# Patient Record
Sex: Female | Born: 1970 | Race: Black or African American | Hispanic: No | Marital: Married | Smoking: Never smoker
Health system: Southern US, Community
[De-identification: ages and names within clinical notes are randomized; demographics above are authoritative.]

## PROBLEM LIST (undated history)

## (undated) DIAGNOSIS — I10 Essential (primary) hypertension: Secondary | ICD-10-CM

## (undated) DIAGNOSIS — I499 Cardiac arrhythmia, unspecified: Secondary | ICD-10-CM

## (undated) DIAGNOSIS — F419 Anxiety disorder, unspecified: Secondary | ICD-10-CM

## (undated) DIAGNOSIS — R519 Headache, unspecified: Secondary | ICD-10-CM

## (undated) DIAGNOSIS — D219 Benign neoplasm of connective and other soft tissue, unspecified: Secondary | ICD-10-CM

## (undated) DIAGNOSIS — Z973 Presence of spectacles and contact lenses: Secondary | ICD-10-CM

## (undated) DIAGNOSIS — R51 Headache: Secondary | ICD-10-CM

## (undated) DIAGNOSIS — D649 Anemia, unspecified: Secondary | ICD-10-CM

## (undated) HISTORY — DX: Benign neoplasm of connective and other soft tissue, unspecified: D21.9

## (undated) HISTORY — PX: MYOMECTOMY: SHX85

## (undated) HISTORY — DX: Essential (primary) hypertension: I10

## (undated) HISTORY — PX: TONSILECTOMY/ADENOIDECTOMY WITH MYRINGOTOMY: SHX6125

---
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
## 2000-03-27 ENCOUNTER — Other Ambulatory Visit: Admission: RE | Admit: 2000-03-27 | Discharge: 2000-03-27 | Payer: Self-pay | Admitting: *Deleted

## 2000-03-27 ENCOUNTER — Encounter (INDEPENDENT_AMBULATORY_CARE_PROVIDER_SITE_OTHER): Payer: Self-pay | Admitting: Specialist

## 2000-12-20 ENCOUNTER — Other Ambulatory Visit: Admission: RE | Admit: 2000-12-20 | Discharge: 2000-12-20 | Payer: Self-pay | Admitting: Obstetrics and Gynecology

## 2001-10-02 ENCOUNTER — Other Ambulatory Visit: Admission: RE | Admit: 2001-10-02 | Discharge: 2001-10-02 | Payer: Self-pay | Admitting: Obstetrics and Gynecology

## 2002-03-25 ENCOUNTER — Emergency Department (HOSPITAL_COMMUNITY): Admission: EM | Admit: 2002-03-25 | Discharge: 2002-03-25 | Payer: Self-pay | Admitting: Emergency Medicine

## 2003-02-27 ENCOUNTER — Other Ambulatory Visit: Admission: RE | Admit: 2003-02-27 | Discharge: 2003-02-27 | Payer: Self-pay | Admitting: Obstetrics and Gynecology

## 2003-10-03 ENCOUNTER — Ambulatory Visit (HOSPITAL_COMMUNITY): Admission: RE | Admit: 2003-10-03 | Discharge: 2003-10-03 | Payer: Self-pay | Admitting: Obstetrics

## 2005-07-28 ENCOUNTER — Emergency Department (HOSPITAL_COMMUNITY): Admission: EM | Admit: 2005-07-28 | Discharge: 2005-07-28 | Payer: Self-pay | Admitting: Family Medicine

## 2006-04-26 ENCOUNTER — Ambulatory Visit (HOSPITAL_COMMUNITY): Admission: RE | Admit: 2006-04-26 | Discharge: 2006-04-26 | Payer: Self-pay | Admitting: Family Medicine

## 2007-05-15 ENCOUNTER — Emergency Department (HOSPITAL_COMMUNITY): Admission: EM | Admit: 2007-05-15 | Discharge: 2007-05-15 | Payer: Self-pay | Admitting: Emergency Medicine

## 2007-10-22 ENCOUNTER — Encounter (INDEPENDENT_AMBULATORY_CARE_PROVIDER_SITE_OTHER): Payer: Self-pay | Admitting: Obstetrics and Gynecology

## 2007-10-22 ENCOUNTER — Inpatient Hospital Stay (HOSPITAL_COMMUNITY): Admission: RE | Admit: 2007-10-22 | Discharge: 2007-10-24 | Payer: Self-pay | Admitting: Obstetrics and Gynecology

## 2008-05-09 DIAGNOSIS — D219 Benign neoplasm of connective and other soft tissue, unspecified: Secondary | ICD-10-CM

## 2008-05-09 HISTORY — DX: Benign neoplasm of connective and other soft tissue, unspecified: D21.9

## 2010-09-21 NOTE — Op Note (Signed)
Cassie Ward, SIPE                 ACCOUNT NO.:  [id number]   MEDICAL RECORD NO.:  [id number]          PATIENT TYPE:  INP   LOCATION:  9312                          FACILITY:  [facility]   PHYSICIAN:  Lenoard Aden, M.D.DATE OF BIRTH:  [birth year]   DATE OF PROCEDURE:  10/22/2007  DATE OF DISCHARGE:                               OPERATIVE REPORT   PREOPERATIVE DIAGNOSIS:  Symptomatic uterine fibroids.   POSTOPERATIVE DIAGNOSIS:  Symptomatic uterine fibroids.   PROCEDURE:  1. Exploratory laparotomy.  2. Multiple abdominal myomectomy.   SURGEON:  Lenoard Aden, MD   ASSISTANT:  Leilani Able, PA-C   ANESTHESIA:  General.   BLOOD LOSS:  100 mL.   COMPLICATIONS:  None.   DRAINS:  Foley.   COUNTS:  Correct.   The patient was taken to recovery in good condition.   SPECIMEN:  Multiple total 8 uterine fibroids to pathology.   BRIEF OPERATIVE NOTE:  After we appraised the risks of anesthesia,  infection, bleeding, injury to abdominal organs, need for repair,  delayed versus immediate complications to include bowel, bladder injury,  possible need for repair, inability to prevent recurrent fibroids in  addition to possible need for hysterectomy, the patient was brought to  the operating room where she was administered general anesthetic without  complications, prepped and draped in usual sterile fashion.  Foley  catheter placed after achieving anesthesia with dilute Marcaine solution  placed.  Pfannenstiel skin incision made with a scalpel.  The fascia was  nicked in midline opened transversely using Mayo scissors.  Rectus  muscles dissected sharply in midline, peritoneum entered sharply with  bladder blade.  Uterus is elevated and exteriorized noting multiple  uterine fibroids, one is posterior above the cervix and two in the lower  mid body and upper left cornual area.  Multiple small subserosal  fibroids are noted.  All the fibroid areas are injected using a dilute  Pitressin solution and at this time the 5 small fibroids were excised  using Allis clipped and removed at the base, one of the base is as per  hemostasis, is closed using 0 Vicryl interrupted mattress suture.  At  this time, posterior fibroid is addressed whereby dilute Pitressin  solution is placed and a vertical incision is made along the lower  posterior portion of the uterus and the fibroid is excised using sharp  and blunt dissection.  Fibroid bed is closed using multiple interrupted  0 Vicryl, 2-0 Vicryl sutures, and baseball sutures closed using a 4-0  Monocryl to close the incision in a continuous running fashion.  At this  time, an anterior incision is made in between the 2 large anterior wall  fibroids.  Fibroid caps were identified and both fibroids were excised  using blunt and sharp dissection.  The defect was closed using multiple  interrupted 0 Vicryl sutures and 2-0 Vicryl sutures.  The incision was  closed with a running baseball stitch of 4-0 Monocryl suture in a  continuous running fashion.  Good hemostasis achieved.  The Interceed is  placed in the anterior and posterior wall without  difficulty.  Good  hemostasis is noted.  Fascia then closed using 0 Monocryl in a  continuous running fashion.  Subcutaneous tissue reapproximated using a  2-0 plain in continuous running fashion.  Skin closed using staples.  The patient tolerated the procedure well and is transferred to recovery  in good condition.      Lenoard Aden, M.D.  Electronically Signed     RJT/MEDQ  D:  10/22/2007  T:  10/23/2007  Job:  [id number]

## 2010-09-21 NOTE — H&P (Signed)
Cassie Ward, Cassie Ward                 ACCOUNT NO.:  [id number]   MEDICAL RECORD NO.:  [id number]          PATIENT TYPE:  AMB   LOCATION:  SDC                           FACILITY:  [facility]   PHYSICIAN:  Lenoard Aden, M.D.DATE OF BIRTH:  [birth year]   DATE OF ADMISSION:  10/22/2007  DATE OF DISCHARGE:                              HISTORY & PHYSICAL   CHIEF COMPLAINT:  Symptomatic fibroids.   HISTORY OF PRESENT ILLNESS:  This is a 40 year old African-American  female G3, P1, who presents with symptomatic uterine fibroids for  conservative therapy and removal.   PAST MEDICAL HISTORY:  She has a history of hypertension, fibroid cysts  noted, ovarian cysts, history of pregnancy x3 with abortion x2, and  vaginal surgery x1.   SOCIAL HISTORY:  She is a non-smoker, non-drinker. Denies domestic or  physical violence.   MEDICATIONS:  Include Benicar, HCTZ, Darvocet as needed for pain.   PAST SURGICAL HISTORY:  She has a history of tonsillectomy.   FAMILY HISTORY:  Hypertension, breast cancer, diabetes, and liver  cancer.   PHYSICAL EXAMINATION:  GENERAL:  A well developed, well nourished,  African-American female in no acute distress.  VITAL SIGNS:  Weight 171 pounds.  HEENT:  Normal.  LUNGS:  Clear.  HEART:  Regular rhythm.  ABDOMEN:  Soft, nontender.  GENITOURINARY:  Pelvic examination reveals the uterus to be 12 to 15  week size. No adnexal masses.  EXTREMITIES:  No cords.  NEUROLOGIC:  Non-focal.  SKIN:  Intact.   DIAGNOSTIC STUDIES:  Ultrasound as previously noted reveals 3  predominant fibroids. One is right intramural 5.5 cm. One is left  subserosal level of the cervix 6.2 cm. One is posterior subserosal about  5 cm.   IMPRESSION/PLAN:  Symptomatic uterine fibroids, for myomectomy and  conservative treatment. Surgical treatment, risks of anesthesia,  infection, bleeding, and intra-abdominal procedure discussed.  Complications including bowel and bladder injury, possible  recurrence of  uterine fibroids to 25% to 50% noted, possible need for hysterectomy of  less than 10% is discussed. The patient acknowledges and will proceed.      Lenoard Aden, M.D.  Electronically Signed     RJT/MEDQ  D:  10/21/2007  T:  10/21/2007  Job:  [id number]

## 2010-09-24 NOTE — Discharge Summary (Signed)
Cassie Ward, Cassie Ward                 ACCOUNT NO.:  [id number]   MEDICAL RECORD NO.:  [id number]          PATIENT TYPE:  INP   LOCATION:  9399                          FACILITY:  [facility]   PHYSICIAN:  Lenoard Aden, M.D.DATE OF BIRTH:  26-Jun-1970   DATE OF ADMISSION:  10/22/2007  DATE OF DISCHARGE:  10/24/2007                               DISCHARGE SUMMARY   The patient underwent an uncomplicated multiple myomectomy.   Postoperative course uncomplicated.   Discharged to home on day #2.  Discharge teaching done.  Percocet for  pain.  Follow up in the office in 4-6 weeks.      Lenoard Aden, M.D.  Electronically Signed     RJT/MEDQ  D:  11/17/2007  T:  11/18/2007  Job:  [id number]

## 2011-01-27 LAB — URINE CULTURE: Colony Count: 40000

## 2011-01-27 LAB — POCT URINALYSIS DIP (DEVICE)
Glucose, UA: NEGATIVE
Hgb urine dipstick: NEGATIVE
Nitrite: NEGATIVE
Operator id: 235561
Protein, ur: 30 — AB
Specific Gravity, Urine: 1.02
Urobilinogen, UA: 2 — ABNORMAL HIGH
pH: 6

## 2011-02-03 LAB — CBC
HCT: 33.8 — ABNORMAL LOW
HCT: 31.4 — ABNORMAL LOW
Hemoglobin: 11.5 — ABNORMAL LOW
Hemoglobin: 10.6 — ABNORMAL LOW
MCHC: 34
MCHC: 33.8
MCV: 81.4
MCV: 83.4
Platelets: 358
Platelets: 285
RBC: 4.16
RBC: 3.76 — ABNORMAL LOW
RDW: 15.9 — ABNORMAL HIGH
RDW: 15.7 — ABNORMAL HIGH
WBC: 5.4
WBC: 9.5

## 2011-02-03 LAB — BASIC METABOLIC PANEL
BUN: 6
CO2: 29
Calcium: 9.6
Chloride: 100
Creatinine, Ser: 0.69
GFR calc Af Amer: >60
GFR calc non Af Amer: >60
Glucose, Bld: 105 — ABNORMAL HIGH
Potassium: 3.8
Sodium: 135

## 2011-02-03 LAB — HCG, SERUM, QUALITATIVE: Preg, Serum: NEGATIVE

## 2011-07-07 ENCOUNTER — Other Ambulatory Visit: Payer: 59

## 2011-07-07 ENCOUNTER — Encounter (INDEPENDENT_AMBULATORY_CARE_PROVIDER_SITE_OTHER): Payer: 59 | Admitting: Obstetrics and Gynecology

## 2011-07-07 DIAGNOSIS — D259 Leiomyoma of uterus, unspecified: Secondary | ICD-10-CM

## 2011-07-07 DIAGNOSIS — N979 Female infertility, unspecified: Secondary | ICD-10-CM

## 2011-07-12 ENCOUNTER — Other Ambulatory Visit: Payer: Self-pay | Admitting: Obstetrics and Gynecology

## 2011-07-12 DIAGNOSIS — Z1231 Encounter for screening mammogram for malignant neoplasm of breast: Secondary | ICD-10-CM

## 2011-07-13 ENCOUNTER — Other Ambulatory Visit: Payer: Self-pay | Admitting: Obstetrics and Gynecology

## 2011-07-13 DIAGNOSIS — N979 Female infertility, unspecified: Secondary | ICD-10-CM

## 2011-07-18 ENCOUNTER — Ambulatory Visit (HOSPITAL_COMMUNITY): Payer: Self-pay

## 2011-07-20 ENCOUNTER — Ambulatory Visit: Payer: Self-pay

## 2011-08-17 ENCOUNTER — Ambulatory Visit (HOSPITAL_COMMUNITY)
Admission: RE | Admit: 2011-08-17 | Discharge: 2011-08-17 | Disposition: A | Discharge status: Home / Self Care | Payer: 59 | Source: Ambulatory Visit | Attending: Obstetrics and Gynecology | Admitting: Obstetrics and Gynecology

## 2011-08-17 DIAGNOSIS — D259 Leiomyoma of uterus, unspecified: Secondary | ICD-10-CM | POA: Insufficient documentation

## 2011-08-17 DIAGNOSIS — N979 Female infertility, unspecified: Secondary | ICD-10-CM

## 2011-08-17 MED ORDER — IOHEXOL 300 MG/ML  SOLN
15.0000 mL | Freq: Once | INTRAMUSCULAR | Status: AC | PRN
  Administered 2011-08-17: 15 mL

## 2012-03-30 ENCOUNTER — Telehealth: Payer: Self-pay | Admitting: Obstetrics and Gynecology

## 2012-03-30 NOTE — Telephone Encounter (Signed)
Tc to pt, states she had severe pelvic pain that woke her up out of her sleep last night. She has been having dull pelvic pain at times and has previously had fibroids removed. Pt states that she took advil which somewhat alleviated the pain. Appointment desk already scheduled pt for office visit with AVS for 04/16/12 which is first available. Advised pt that she can be seen at an urgent care or her PCP if she would like, states that she will wait to be seen by AVS. Advised pt that she may alternate ibuprofen and tylenol for pain relief. Pt voiced understanding.

## 2012-04-16 ENCOUNTER — Encounter: Payer: 59 | Admitting: Obstetrics and Gynecology

## 2012-04-18 ENCOUNTER — Telehealth: Payer: Self-pay | Admitting: Obstetrics and Gynecology

## 2012-04-19 ENCOUNTER — Ambulatory Visit (INDEPENDENT_AMBULATORY_CARE_PROVIDER_SITE_OTHER): Payer: 59 | Admitting: Obstetrics and Gynecology

## 2012-04-19 ENCOUNTER — Encounter: Payer: Self-pay | Admitting: Obstetrics and Gynecology

## 2012-04-19 VITALS — BP 120/70 | Ht 64.0 in | Wt 178.0 lb

## 2012-04-19 DIAGNOSIS — N946 Dysmenorrhea, unspecified: Secondary | ICD-10-CM

## 2012-04-19 DIAGNOSIS — D219 Benign neoplasm of connective and other soft tissue, unspecified: Secondary | ICD-10-CM

## 2012-04-19 DIAGNOSIS — N898 Other specified noninflammatory disorders of vagina: Secondary | ICD-10-CM

## 2012-04-19 DIAGNOSIS — D259 Leiomyoma of uterus, unspecified: Secondary | ICD-10-CM

## 2012-04-19 NOTE — Progress Notes (Signed)
HISTORY OF PRESENT ILLNESS  [name] is a 41 y.o. year old female,G2P0010, who presents for a problem visit. The patient complains of increased pelvic pain and dysmenorrhea. The patient has a known history of fibroids. In February 2013 and ultrasound showed a 10.9 x 6.8 cm uterus. 3 fibroids were noted. The largest fibroid measured 3.3 cm in size. She had a prior myomectomy in 2009.  Subjective:  At one time the patient had wanted to have another baby. She no longer wants that. She complains of excessive wetness from the vagina. She denies a vaginal odor and she says it is not a discharge.  Objective:  BP 120/70  Ht 5\' 4"  (1.626 m)  Wt 178 lb (80.74 kg)  BMI 30.55 kg/m2  LMP 03/29/2012   General: no distress GI: soft and nontender  External genitalia: normal general appearance Vaginal: normal without tenderness, induration or masses and relaxation noted Cervix: normal appearance Adnexa: normal bimanual exam Uterus: 10 weeks, irregular, firm, tender  Assessment:  Dysmenorrhea  Pelvic pain  Fibroids  Prior myomectomy  Plan:  Management of fibroids was reviewed. Risk and benefits discussed for each option. The patient is interested in a traditional myomectomy (declines robotic myomectomy).  Ultrasound next visit.  Return to office in 2 week(s).   Leonard Schwartz M.D.  04/19/2012 11:27 AM

## 2012-05-16 ENCOUNTER — Ambulatory Visit (INDEPENDENT_AMBULATORY_CARE_PROVIDER_SITE_OTHER): Payer: 59

## 2012-05-16 ENCOUNTER — Encounter: Payer: Self-pay | Admitting: Obstetrics and Gynecology

## 2012-05-16 ENCOUNTER — Ambulatory Visit (INDEPENDENT_AMBULATORY_CARE_PROVIDER_SITE_OTHER): Payer: 59 | Admitting: Obstetrics and Gynecology

## 2012-05-16 VITALS — BP 110/80 | Resp 18 | Wt 179.0 lb

## 2012-05-16 DIAGNOSIS — N949 Unspecified condition associated with female genital organs and menstrual cycle: Secondary | ICD-10-CM

## 2012-05-16 DIAGNOSIS — D259 Leiomyoma of uterus, unspecified: Secondary | ICD-10-CM

## 2012-05-16 DIAGNOSIS — N920 Excessive and frequent menstruation with regular cycle: Secondary | ICD-10-CM

## 2012-05-16 DIAGNOSIS — D219 Benign neoplasm of connective and other soft tissue, unspecified: Secondary | ICD-10-CM | POA: Insufficient documentation

## 2012-05-16 DIAGNOSIS — N946 Dysmenorrhea, unspecified: Secondary | ICD-10-CM

## 2012-05-16 DIAGNOSIS — R102 Pelvic and perineal pain unspecified side: Secondary | ICD-10-CM | POA: Insufficient documentation

## 2012-05-16 NOTE — Progress Notes (Signed)
HISTORY OF PRESENT ILLNESS  [name] is a 42 y.o. year old female,G2P0010, who presents for a problem visit. The patient has a known history of fibroids. She wants to have children. She has had a past myomectomy. She has a history of hypertension.  Subjective:  The patient reports that she has dysmenorrhea as well as menorrhagia. Reports that she takes lots of ibuprofen. Her back also hurts.  Objective:  BP 110/80  Resp 18  Wt 179 lb (81.194 kg)  LMP 04/28/2012   GI: soft and nontender back: No CVA tenderness  Exam deferred.  Ultrasound: Uterus is 8.86 x 7.88 cm. Endometrium measures 4-5 fibroids are seen. The largest fibroid measures 2.9 cm and it seems to be submucosal.  Assessment:  Fibroid uterus  Menorrhagia  Dysmenorrhea  Prior myomectomy  Pelvic pain  Plan:  Management options were reviewed. Risk and benefits were discussed. The patient will consider hysteroscopic resection of the submucosal fibroid. A handout was given to the patient.  Return to office prn if symptoms worsen or fail to improve.   Leonard Schwartz M.D.  05/16/2012 6:22 PM

## 2012-06-11 ENCOUNTER — Encounter: Payer: Self-pay | Admitting: Obstetrics and Gynecology

## 2012-06-11 ENCOUNTER — Ambulatory Visit: Payer: 59 | Admitting: Obstetrics and Gynecology

## 2012-06-11 VITALS — BP 120/80 | Temp 98.5°F | Wt 178.0 lb

## 2012-06-11 DIAGNOSIS — R350 Frequency of micturition: Secondary | ICD-10-CM

## 2012-06-11 DIAGNOSIS — N898 Other specified noninflammatory disorders of vagina: Secondary | ICD-10-CM

## 2012-06-11 LAB — POCT URINALYSIS DIPSTICK
Bilirubin, UA: NEGATIVE
Blood, UA: NEGATIVE
Glucose, UA: NEGATIVE
Ketones, UA: NEGATIVE
Leukocytes, UA: NEGATIVE
Nitrite, UA: NEGATIVE
Protein, UA: NEGATIVE
Spec Grav, UA: 1.015
Urobilinogen, UA: NEGATIVE
pH, UA: 6

## 2012-06-11 LAB — POCT WET PREP (WET MOUNT)
Clue Cells Wet Prep Whiff POC: NEGATIVE
Trichomonas Wet Prep HPF POC: NEGATIVE
Whiff Test: NEGATIVE
pH: 4.5

## 2012-06-11 MED ORDER — SULFAMETHOXAZOLE-TRIMETHOPRIM 800-160 MG PO TABS: 1.0000 | ORAL_TABLET | Freq: Two times a day (BID) | ORAL | Status: DC

## 2012-06-11 NOTE — Progress Notes (Signed)
HISTORY OF PRESENT ILLNESS  [name] is a 42 y.o. year old female,G2P0010, who presents for a problem visit. The patient has a known history of fibroid.  One is known to be submucosal.  Subjective:  The patient complains of menorrhagia, dysmenorrhea, a clear vaginal discharge, dysuria, and low back pain.  Objective:  BP 120/80  Temp 98.5 F (36.9 C) (Oral)  Wt 178 lb (80.74 kg)  LMP 05/24/2012   General: no distress Resp: clear to auscultation bilaterally Cardio: regular rate and rhythm, S1, S2 normal, no murmur, click, rub or gallop GI: soft and nontender Back: No CVA tenderness  External genitalia: normal general appearance Vaginal: normal without tenderness, induration or masses Cervix: normal appearance Adnexa: normal bimanual exam Uterus: 8-10 weeks size, irregular, firm  Wet prep: PH 4.5, negative disease, negative clue cells, negative trichomoniasis,whiff negative.  UA: negative  Assessment:  Fibroids  Dysmenorrhea  Dysuria  Vaginal discharge  Low back pain  Menorrhagia  Plan:  Management of her fibroids was again reviewed.  The patient wants to proceed with hysteroscopy with resection.  We will schedule.  Risks and benefits reviewed.  Urine culture.  Septra DS one by mouth twice a day for 3 days.  Return to office prn if symptoms worsen or fail to improve.   Leonard Schwartz M.D.  06/11/2012 11:21 AM    URINARY INCONTINENCE: Vag. Discharge:yes "Clear" Odor:no Fever:no Irreg.Periods:yes Dyspareunia:no Dysuria:no Frequency:yes Urgency:yes Hematuria:no Kidney stones:no Constipation:yes Diarrhea:no Rectal Bleeding: no Vomiting:no Nausea:no Pregnant:no Fibroids:yes Endometriosis:no Hx of Ovarian Cyst:no Hx IUD:no Hx STD-PID:no Appendectomy:no Gall Bladder Dz:no

## 2012-06-11 NOTE — Addendum Note (Signed)
Addended by: Tim Lair on: 06/11/2012 12:14 PM   Modules accepted: Orders

## 2012-06-13 LAB — URINE CULTURE: Colony Count: 30000

## 2012-06-18 ENCOUNTER — Encounter: Payer: 59 | Admitting: Obstetrics and Gynecology

## 2012-06-20 ENCOUNTER — Telehealth: Payer: Self-pay | Admitting: Obstetrics and Gynecology

## 2012-06-20 NOTE — Telephone Encounter (Signed)
Hysteroscopy with Resection of Submucosal Fibroid scheduled for 07/23/12 @ 11:15 with AVS. Adrianne Pridgen

## 2012-06-29 ENCOUNTER — Other Ambulatory Visit: Payer: Self-pay | Admitting: Obstetrics and Gynecology

## 2012-07-02 ENCOUNTER — Telehealth: Payer: Self-pay | Admitting: Obstetrics and Gynecology

## 2012-07-02 NOTE — Telephone Encounter (Signed)
Hysteroscopy with Resection of Submucosal Fibroid rescheduled to 07/23/12 @ 10:45 with AVS. Cassie Ward

## 2012-07-08 ENCOUNTER — Emergency Department (HOSPITAL_COMMUNITY)
Admission: EM | Admit: 2012-07-08 | Discharge: 2012-07-09 | Disposition: A | Discharge status: Home / Self Care | Payer: 59 | Attending: Emergency Medicine | Admitting: Emergency Medicine

## 2012-07-08 DIAGNOSIS — D219 Benign neoplasm of connective and other soft tissue, unspecified: Secondary | ICD-10-CM

## 2012-07-08 DIAGNOSIS — I10 Essential (primary) hypertension: Secondary | ICD-10-CM | POA: Insufficient documentation

## 2012-07-08 DIAGNOSIS — D259 Leiomyoma of uterus, unspecified: Secondary | ICD-10-CM | POA: Insufficient documentation

## 2012-07-08 DIAGNOSIS — R1031 Right lower quadrant pain: Secondary | ICD-10-CM | POA: Insufficient documentation

## 2012-07-08 DIAGNOSIS — Z3202 Encounter for pregnancy test, result negative: Secondary | ICD-10-CM | POA: Insufficient documentation

## 2012-07-08 DIAGNOSIS — R109 Unspecified abdominal pain: Secondary | ICD-10-CM

## 2012-07-09 ENCOUNTER — Encounter (HOSPITAL_COMMUNITY): Payer: Self-pay | Admitting: Pharmacist

## 2012-07-09 ENCOUNTER — Emergency Department (HOSPITAL_COMMUNITY): Payer: 59

## 2012-07-09 ENCOUNTER — Encounter (HOSPITAL_COMMUNITY): Payer: Self-pay | Admitting: Emergency Medicine

## 2012-07-09 LAB — URINALYSIS, ROUTINE W REFLEX MICROSCOPIC
Bilirubin Urine: NEGATIVE
Glucose, UA: NEGATIVE mg/dL
Ketones, ur: NEGATIVE mg/dL
Leukocytes, UA: NEGATIVE
Nitrite: NEGATIVE
Protein, ur: NEGATIVE mg/dL
Specific Gravity, Urine: 1.013 (ref 1.005–1.030)
Urobilinogen, UA: 0.2 mg/dL (ref 0.0–1.0)
pH: 8 (ref 5.0–8.0)

## 2012-07-09 LAB — COMPREHENSIVE METABOLIC PANEL
ALT: 10 U/L (ref 0–35)
AST: 15 U/L (ref 0–37)
Albumin: 3.4 g/dL — ABNORMAL LOW (ref 3.5–5.2)
Alkaline Phosphatase: 52 U/L (ref 39–117)
BUN: 10 mg/dL (ref 6–23)
CO2: 26 mEq/L (ref 19–32)
Calcium: 9 mg/dL (ref 8.4–10.5)
Chloride: 99 mEq/L (ref 96–112)
Creatinine, Ser: 0.97 mg/dL (ref 0.50–1.10)
GFR calc Af Amer: 81 mL/min — ABNORMAL LOW (ref >90)
GFR calc non Af Amer: 70 mL/min — ABNORMAL LOW (ref >90)
Glucose, Bld: 96 mg/dL (ref 70–99)
Potassium: 3.4 mEq/L — ABNORMAL LOW (ref 3.5–5.1)
Sodium: 133 mEq/L — ABNORMAL LOW (ref 135–145)
Total Bilirubin: 0.1 mg/dL — ABNORMAL LOW (ref 0.3–1.2)
Total Protein: 6.7 g/dL (ref 6.0–8.3)

## 2012-07-09 LAB — CBC WITH DIFFERENTIAL/PLATELET
Basophils Absolute: 0 10*3/uL (ref 0.0–0.1)
Basophils Relative: 0 % (ref 0–1)
Eosinophils Absolute: 0.2 10*3/uL (ref 0.0–0.7)
Eosinophils Relative: 3 % (ref 0–5)
HCT: 25.7 % — ABNORMAL LOW (ref 36.0–46.0)
Hemoglobin: 7.6 g/dL — ABNORMAL LOW (ref 12.0–15.0)
Lymphocytes Relative: 34 % (ref 12–46)
Lymphs Abs: 1.8 10*3/uL (ref 0.7–4.0)
MCH: 19.9 pg — ABNORMAL LOW (ref 26.0–34.0)
MCHC: 29.6 g/dL — ABNORMAL LOW (ref 30.0–36.0)
MCV: 67.5 fL — ABNORMAL LOW (ref 78.0–100.0)
Monocytes Absolute: 0.6 10*3/uL (ref 0.1–1.0)
Monocytes Relative: 12 % (ref 3–12)
Neutro Abs: 2.8 10*3/uL (ref 1.7–7.7)
Neutrophils Relative %: 51 % (ref 43–77)
Platelets: 353 10*3/uL (ref 150–400)
RBC: 3.81 MIL/uL — ABNORMAL LOW (ref 3.87–5.11)
RDW: 18 % — ABNORMAL HIGH (ref 11.5–15.5)
WBC: 5.4 10*3/uL (ref 4.0–10.5)

## 2012-07-09 LAB — PREGNANCY, URINE: Preg Test, Ur: NEGATIVE

## 2012-07-09 LAB — URINE MICROSCOPIC-ADD ON

## 2012-07-09 MED ORDER — SODIUM CHLORIDE 0.9 % IV BOLUS (SEPSIS)
1000.0000 mL | Freq: Once | INTRAVENOUS | Status: AC
  Administered 2012-07-09: 1000 mL via INTRAVENOUS

## 2012-07-09 MED ORDER — HYDROMORPHONE HCL PF 1 MG/ML IJ SOLN
1.0000 mg | Freq: Once | INTRAMUSCULAR | Status: AC
  Administered 2012-07-09: 1 mg via INTRAVENOUS
  Filled 2012-07-09: qty 1

## 2012-07-09 MED ORDER — IOHEXOL 300 MG/ML  SOLN
50.0000 mL | Freq: Once | INTRAMUSCULAR | Status: AC | PRN
  Administered 2012-07-09: 50 mL via ORAL

## 2012-07-09 MED ORDER — OXYCODONE-ACETAMINOPHEN 5-325 MG PO TABS: 2.0000 | ORAL_TABLET | ORAL | Status: DC | PRN

## 2012-07-09 MED ORDER — PROMETHAZINE HCL 25 MG PO TABS: 25.0000 mg | ORAL_TABLET | Freq: Four times a day (QID) | ORAL | Status: DC | PRN

## 2012-07-09 MED ORDER — IOHEXOL 300 MG/ML  SOLN
100.0000 mL | Freq: Once | INTRAMUSCULAR | Status: AC | PRN
  Administered 2012-07-09: 100 mL via INTRAVENOUS

## 2012-07-09 MED ORDER — ONDANSETRON HCL 4 MG/2ML IJ SOLN
4.0000 mg | Freq: Once | INTRAMUSCULAR | Status: AC
  Administered 2012-07-09: 4 mg via INTRAVENOUS
  Filled 2012-07-09: qty 2

## 2012-07-09 NOTE — ED Notes (Signed)
Pt c/o abd pain that start at 8pm, pain was a quick sharp pain. Tender with palpation. Pt was assessed by MD

## 2012-07-09 NOTE — ED Notes (Addendum)
Pt has RLQ pain with nausea, denies vomiting. Describes pain location radiating to pelvic. Pt states she has not seen blood in urine.

## 2012-07-09 NOTE — ED Provider Notes (Signed)
History     CSN: [id number]  Arrival date & time 07/08/12  2341   First MD Initiated Contact with Patient 07/08/12 2354      Chief Complaint  Patient presents with  . Abdominal Pain    (Consider location/radiation/quality/duration/timing/severity/associated sxs/prior treatment) HPI.... right lower quadrant pain since 8 PM. Pain is mild to moderate and described as sharp. Nothing makes symptoms better or worse. No radiation. No vaginal bleeding or discharge. No fever sweats chills or dysuria. Appetite okay.  Past Medical History  Diagnosis Date  . Fibroids 2010  . Hypertension     Past Surgical History  Procedure Laterality Date  . Tonsilectomy/adenoidectomy with myringotomy    . Myomectomy      Family History  Problem Relation Age of Onset  . Cancer Mother     Breast    History  Substance Use Topics  . Smoking status: Never Smoker   . Smokeless tobacco: Never Used  . Alcohol Use: Yes    OB History   Grav Para Term Preterm Abortions TAB SAB Ect Mult Living   2 1   1            Review of Systems  All other systems reviewed and are negative.    Allergies  Review of patient's allergies indicates no known allergies.  Home Medications   Current Outpatient Rx  Name  Route  Sig  Dispense  Refill  . sulfamethoxazole-trimethoprim (BACTRIM DS,SEPTRA DS) 800-160 MG per tablet   Oral   Take 1 tablet by mouth 2 (two) times daily.         Marland Kitchen oxyCODONE-acetaminophen (PERCOCET) 5-325 MG per tablet   Oral   Take 2 tablets by mouth every 4 (four) hours as needed for pain.   20 tablet   0   . promethazine (PHENERGAN) 25 MG tablet   Oral   Take 1 tablet (25 mg total) by mouth every 6 (six) hours as needed for nausea.   20 tablet   0     BP 145/95  Pulse 86  Temp(Src) 98.7 F (37.1 C) (Oral)  Resp 18  SpO2 100%  LMP 06/20/2012  Physical Exam  Nursing note and vitals reviewed. Constitutional: She is oriented to person, place, and time. She appears  well-developed and well-nourished.  HENT:  Head: Normocephalic and atraumatic.  Eyes: Conjunctivae and EOM are normal. Pupils are equal, round, and reactive to light.  Neck: Normal range of motion. Neck supple.  Cardiovascular: Normal rate, regular rhythm and normal heart sounds.   Pulmonary/Chest: Effort normal and breath sounds normal.  Abdominal: Soft. Bowel sounds are normal.  Minimal right lower quadrant tenderness  Musculoskeletal: Normal range of motion.  Neurological: She is alert and oriented to person, place, and time.  Skin: Skin is warm and dry.  Psychiatric: She has a normal mood and affect.    ED Course  Procedures (including critical care time)  Labs Reviewed  CBC WITH DIFFERENTIAL - Abnormal; Notable for the following:    RBC 3.81 (*)    Hemoglobin 7.6 (*)    HCT 25.7 (*)    MCV 67.5 (*)    MCH 19.9 (*)    MCHC 29.6 (*)    RDW 18.0 (*)    All other components within normal limits  COMPREHENSIVE METABOLIC PANEL - Abnormal; Notable for the following:    Sodium 133 (*)    Potassium 3.4 (*)    Albumin 3.4 (*)    Total Bilirubin 0.1 (*)  GFR calc non Af Amer 70 (*)    GFR calc Af Amer 81 (*)    All other components within normal limits  URINALYSIS, ROUTINE W REFLEX MICROSCOPIC - Abnormal; Notable for the following:    Hgb urine dipstick SMALL (*)    All other components within normal limits  PREGNANCY, URINE  URINE MICROSCOPIC-ADD ON   Ct Abdomen Pelvis W Contrast  07/09/2012  *RADIOLOGY REPORT*  Clinical Data: Right lower quadrant abdominal pain and tenderness.  CT ABDOMEN AND PELVIS WITH CONTRAST  Technique:  Multidetector CT imaging of the abdomen and pelvis was performed following the standard protocol during bolus administration of intravenous contrast.  Contrast: OMNIPAQUE IOHEXOL 300 MG/ML  SOLN  Comparison: None.  Findings: The visualized lung bases are clear.  The liver is unremarkable in appearance.  A 2.6 cm cyst is noted at the posterior aspect  of the spleen.  The spleen is otherwise grossly unremarkable.  The gallbladder is within normal limits. The pancreas and adrenal glands are unremarkable.  The kidneys are unremarkable in appearance.  There is no evidence of hydronephrosis.  No renal or ureteral stones are seen.  No perinephric stranding is appreciated.  No free fluid is identified.  The small bowel is unremarkable in appearance.  The stomach is filled with solid material and contrast, and is within normal limits.  No acute vascular abnormalities are seen.  The appendix is normal in caliber and contains air, without evidence for appendicitis.  The colon is unremarkable in appearance.  The bladder is mildly distended and grossly unremarkable.  The uterus is mildly enlarged; there appears to be a 3.1 cm submucosal fibroid partially filling the endometrial canal, with surrounding fluid.  Additional smaller fibroids are also seen.  The ovaries are grossly symmetric; no suspicious adnexal masses are seen.  No inguinal lymphadenopathy is seen.  No acute osseous abnormalities are identified.  IMPRESSION:  1.  No acute abnormality seen within the abdomen or pelvis. 2.  Uterus mildly enlarged, with a 3.1 cm apparent submucosal fibroid partially filling the endometrial canal, and surrounding fluid in the endometrial canal.  Additional smaller fibroids also seen. 3.  Splenic cyst noted.   Original Report Authenticated By: Tonia Ghent, M.D.      1. Abdominal pain   2. Fibroids       MDM  No acute abdomen. CT scan shows a 3.1 cm fibroid. Small amount of hemoglobin in urine. Patient feeling much better after IV fluids and pain management.        Donnetta Hutching, MD 07/10/12 0001

## 2012-07-09 NOTE — ED Notes (Signed)
Patient transported to CT 

## 2012-07-09 NOTE — ED Notes (Signed)
Dr Cook at bedside

## 2012-07-10 ENCOUNTER — Telehealth: Payer: Self-pay | Admitting: Obstetrics and Gynecology

## 2012-07-10 ENCOUNTER — Other Ambulatory Visit: Payer: Self-pay | Admitting: Obstetrics and Gynecology

## 2012-07-10 NOTE — Telephone Encounter (Signed)
Hysteroscopy with Resection of Submucosal Fibroid rescheduled to 07/23/12 @ 10:45 with AVS. [employer] pays 70/30 after a $3,500 deductible. Pre-op due $463.99 -Adrianne Pridgen

## 2012-07-17 ENCOUNTER — Telehealth: Payer: Self-pay | Admitting: Obstetrics and Gynecology

## 2012-07-17 NOTE — Telephone Encounter (Signed)
TC from patient returning my call regarding rescheduling 3/17 surgery.  LMP 06/20/12 Patient requesting a Monday day for reschedule. Looking for 4/21 and 4/28.  I told her that those were days that you were not available and she asked me to ask. -Adrianne Pridgen

## 2012-07-19 ENCOUNTER — Telehealth: Payer: Self-pay | Admitting: Obstetrics and Gynecology

## 2012-07-19 NOTE — Telephone Encounter (Signed)
Hysteroscopy with Resection of Submucosal Fibroid rescheduled to 07/23/12 @ 11:15 with AVS. [employer] pays 70/30 after a $3,500 deductible. Pre-op due $463.99 -Adrianne Pridgen

## 2012-07-22 ENCOUNTER — Telehealth: Payer: Self-pay | Admitting: Obstetrics and Gynecology

## 2012-07-22 NOTE — Telephone Encounter (Signed)
Pt called on her cell phone. No answer. Message left.  AVS

## 2012-07-22 NOTE — H&P (Signed)
Admission History and Physical Exam for a Gynecology Patient  Cassie Ward is a 42 y.o. female, G2P0010, who presents for hysteroscopy and resection of a submucosal fibroid, and a D and C. She has been followed at the [facility] and Gynecology division of [facility]. She has a 2.9 cm submucosal fibroid. She has had a myomectomy. She has menorrhagia.  OB History   Grav Para Term Preterm Abortions TAB SAB Ect Mult Living   2 1   1            Past Medical History  Diagnosis Date  . Fibroids 2010  . Hypertension     No prescriptions prior to admission    Past Surgical History  Procedure Laterality Date  . Tonsilectomy/adenoidectomy with myringotomy    . Myomectomy      No Known Allergies  Family History: family history includes Cancer in her mother.  Social History:  reports that she has never smoked. She has never used smokeless tobacco. She reports that  drinks alcohol. She reports that she does not use illicit drugs.  Review of systems: See HPI.  Admission Physical Exam:    There is no weight on file to calculate BMI.  There were no vitals taken for this visit.  HEENT:                 Within normal limits Chest:                   Clear Heart:                    Regular rate and rhythm Breasts:                No masses, skin changes, bleeding, or discharge present Abdomen:             Nontender, no masses Extremities:          Grossly normal Neurologic exam: Grossly normal  Pelvic exam:  External genitalia: normal general appearance Vaginal: normal without tenderness, induration or masses Cervix: normal appearance Adnexa: normal bimanual exam Uterus: 8-10 weeks, irregular.  Assessment:  Submucosal fibroids  Menorrhagia  History of a Myomectomy  Plan:  Hysteroscopy with resection, D and C.   Nicco Reaume V 07/22/2012

## 2012-07-23 ENCOUNTER — Encounter (HOSPITAL_COMMUNITY): Admission: RE | Payer: Self-pay | Source: Ambulatory Visit

## 2012-07-23 ENCOUNTER — Ambulatory Visit (HOSPITAL_COMMUNITY): Admission: RE | Admit: 2012-07-23 | Payer: 59 | Source: Ambulatory Visit | Admitting: Obstetrics and Gynecology

## 2012-07-23 SURGERY — DILATATION & CURETTAGE/HYSTEROSCOPY WITH RESECTOCOPE
Anesthesia: General

## 2012-08-22 ENCOUNTER — Other Ambulatory Visit: Payer: Self-pay | Admitting: Obstetrics and Gynecology

## 2012-08-22 ENCOUNTER — Telehealth: Payer: Self-pay | Admitting: Obstetrics and Gynecology

## 2012-08-22 MED ORDER — LACTATED RINGERS IV SOLN: INTRAVENOUS | Status: DC

## 2012-08-22 NOTE — H&P (Signed)
Admission History and Physical Exam for a Gynecology Patient   [name] is a 42 y.o. female, G2P0010, who presents for hysteroscopy and resection of a submucosal fibroid, and a D and C. She has been followed at the [facility] and Gynecology division of [facility]. She has a 2.9 cm submucosal fibroid. She has had a myomectomy. She has menorrhagia.   OB History    Grav  Para  Term  Preterm  Abortions  TAB  SAB  Ect  Mult  Living    2  1    1             Past Medical History   Diagnosis  Date   .  Fibroids  2010   .  Hypertension      No prescriptions prior to admission     Past Surgical History   Procedure  Laterality  Date   .  Tonsilectomy/adenoidectomy with myringotomy     .  Myomectomy      No Known Allergies   Family History: family history includes Cancer in her mother.   Social History: reports that she has never smoked. She has never used smokeless tobacco. She reports that drinks alcohol. She reports that she does not use illicit drugs.   Review of systems: See HPI.   Admission Physical Exam:  There is no weight on file to calculate BMI.  There were no vitals taken for this visit.  HEENT: Within normal limits  Chest: Clear  Heart: Regular rate and rhythm  Breasts: No masses, skin changes, bleeding, or discharge present  Abdomen: Nontender, no masses  Extremities: Grossly normal  Neurologic exam: Grossly normal  Pelvic exam:  External genitalia: normal general appearance  Vaginal: normal without tenderness, induration or masses  Cervix: normal appearance  Adnexa: normal bimanual exam  Uterus: 8-10 weeks, irregular.   Assessment:   Submucosal fibroids  Menorrhagia  History of a Myomectomy   Plan:   Hysteroscopy with resection, D and Clayton Bibles MD, Marlowe Sax

## 2012-08-23 ENCOUNTER — Ambulatory Visit (HOSPITAL_COMMUNITY): Payer: 59 | Admitting: Anesthesiology

## 2012-08-23 ENCOUNTER — Encounter (HOSPITAL_COMMUNITY)
Admission: RE | Disposition: A | Discharge status: Home / Self Care | Payer: Self-pay | Source: Ambulatory Visit | Attending: Obstetrics and Gynecology

## 2012-08-23 ENCOUNTER — Ambulatory Visit (HOSPITAL_COMMUNITY)
Admission: RE | Admit: 2012-08-23 | Discharge: 2012-08-23 | Disposition: A | Discharge status: Home / Self Care | Payer: 59 | Source: Ambulatory Visit | Attending: Obstetrics and Gynecology | Admitting: Obstetrics and Gynecology

## 2012-08-23 ENCOUNTER — Encounter (HOSPITAL_COMMUNITY): Payer: Self-pay | Admitting: Anesthesiology

## 2012-08-23 ENCOUNTER — Encounter (HOSPITAL_COMMUNITY): Payer: Self-pay | Admitting: *Deleted

## 2012-08-23 DIAGNOSIS — D649 Anemia, unspecified: Secondary | ICD-10-CM | POA: Insufficient documentation

## 2012-08-23 DIAGNOSIS — N920 Excessive and frequent menstruation with regular cycle: Secondary | ICD-10-CM | POA: Insufficient documentation

## 2012-08-23 DIAGNOSIS — I10 Essential (primary) hypertension: Secondary | ICD-10-CM | POA: Insufficient documentation

## 2012-08-23 DIAGNOSIS — D250 Submucous leiomyoma of uterus: Secondary | ICD-10-CM | POA: Insufficient documentation

## 2012-08-23 DIAGNOSIS — N946 Dysmenorrhea, unspecified: Secondary | ICD-10-CM | POA: Insufficient documentation

## 2012-08-23 HISTORY — PX: DILATATION & CURRETTAGE/HYSTEROSCOPY WITH RESECTOCOPE: SHX5572

## 2012-08-23 LAB — PREGNANCY, URINE: Preg Test, Ur: NEGATIVE

## 2012-08-23 LAB — CBC
HCT: 26.8 % — ABNORMAL LOW (ref 36.0–46.0)
Hemoglobin: 7.9 g/dL — ABNORMAL LOW (ref 12.0–15.0)
MCH: 20.2 pg — ABNORMAL LOW (ref 26.0–34.0)
MCHC: 29.5 g/dL — ABNORMAL LOW (ref 30.0–36.0)
MCV: 68.4 fL — ABNORMAL LOW (ref 78.0–100.0)
Platelets: 343 10*3/uL (ref 150–400)
RBC: 3.92 MIL/uL (ref 3.87–5.11)
RDW: 19.4 % — ABNORMAL HIGH (ref 11.5–15.5)
WBC: 5.3 10*3/uL (ref 4.0–10.5)

## 2012-08-23 SURGERY — DILATATION & CURETTAGE/HYSTEROSCOPY WITH RESECTOCOPE
Anesthesia: General | Site: Vagina | Wound class: Clean Contaminated

## 2012-08-23 MED ORDER — LIDOCAINE HCL (CARDIAC) 20 MG/ML IV SOLN
INTRAVENOUS | Status: DC | PRN
  Administered 2012-08-23 (×2): 30 mg via INTRAVENOUS

## 2012-08-23 MED ORDER — KETOROLAC TROMETHAMINE 60 MG/2ML IM SOLN
INTRAMUSCULAR | Status: DC | PRN
  Administered 2012-08-23: 30 mg via INTRAMUSCULAR

## 2012-08-23 MED ORDER — KETOROLAC TROMETHAMINE 30 MG/ML IJ SOLN
INTRAMUSCULAR | Status: AC
  Filled 2012-08-23: qty 1

## 2012-08-23 MED ORDER — LIDOCAINE HCL (CARDIAC) 20 MG/ML IV SOLN
INTRAVENOUS | Status: AC
  Filled 2012-08-23: qty 5

## 2012-08-23 MED ORDER — ONDANSETRON HCL 4 MG/2ML IJ SOLN
INTRAMUSCULAR | Status: AC
  Filled 2012-08-23: qty 2

## 2012-08-23 MED ORDER — MIDAZOLAM HCL 5 MG/5ML IJ SOLN
INTRAMUSCULAR | Status: DC | PRN
  Administered 2012-08-23: 2 mg via INTRAVENOUS

## 2012-08-23 MED ORDER — GLYCINE 1.5 % IR SOLN
Status: DC | PRN
  Administered 2012-08-23 (×2): 3000 mL

## 2012-08-23 MED ORDER — KETOROLAC TROMETHAMINE 30 MG/ML IJ SOLN
INTRAMUSCULAR | Status: DC | PRN
  Administered 2012-08-23: 30 mg via INTRAVENOUS

## 2012-08-23 MED ORDER — IBUPROFEN 800 MG PO TABS: 800.0000 mg | ORAL_TABLET | Freq: Three times a day (TID) | ORAL | Status: DC | PRN

## 2012-08-23 MED ORDER — PROMETHAZINE HCL 12.5 MG PO TABS: 12.5000 mg | ORAL_TABLET | Freq: Four times a day (QID) | ORAL | Status: DC | PRN

## 2012-08-23 MED ORDER — FENTANYL CITRATE 0.05 MG/ML IJ SOLN
INTRAMUSCULAR | Status: DC | PRN
  Administered 2012-08-23 (×4): 50 ug via INTRAVENOUS

## 2012-08-23 MED ORDER — BUPIVACAINE-EPINEPHRINE 0.5% -1:200000 IJ SOLN
INTRAMUSCULAR | Status: DC | PRN
  Administered 2012-08-23: 10 mL

## 2012-08-23 MED ORDER — DEXAMETHASONE SODIUM PHOSPHATE 10 MG/ML IJ SOLN
INTRAMUSCULAR | Status: AC
  Filled 2012-08-23: qty 1

## 2012-08-23 MED ORDER — GLYCOPYRROLATE 0.2 MG/ML IJ SOLN
INTRAMUSCULAR | Status: AC
  Filled 2012-08-23: qty 1

## 2012-08-23 MED ORDER — EPHEDRINE SULFATE 50 MG/ML IJ SOLN
INTRAMUSCULAR | Status: DC | PRN
  Administered 2012-08-23: 10 mg via INTRAVENOUS

## 2012-08-23 MED ORDER — PROPOFOL 10 MG/ML IV EMUL
INTRAVENOUS | Status: AC
  Filled 2012-08-23: qty 20

## 2012-08-23 MED ORDER — BUPIVACAINE-EPINEPHRINE (PF) 0.5% -1:200000 IJ SOLN
INTRAMUSCULAR | Status: AC
  Filled 2012-08-23: qty 10

## 2012-08-23 MED ORDER — MIDAZOLAM HCL 2 MG/2ML IJ SOLN
INTRAMUSCULAR | Status: AC
  Filled 2012-08-23: qty 2

## 2012-08-23 MED ORDER — PROPOFOL 10 MG/ML IV EMUL
INTRAVENOUS | Status: DC | PRN
  Administered 2012-08-23: 180 mg via INTRAVENOUS

## 2012-08-23 MED ORDER — FENTANYL CITRATE 0.05 MG/ML IJ SOLN
INTRAMUSCULAR | Status: AC
  Filled 2012-08-23: qty 4

## 2012-08-23 MED ORDER — HYDROCODONE-ACETAMINOPHEN 5-300 MG PO TABS: 1.0000 | ORAL_TABLET | ORAL | Status: DC | PRN

## 2012-08-23 MED ORDER — LACTATED RINGERS IV SOLN
INTRAVENOUS | Status: DC
  Administered 2012-08-23: 13:00:00 via INTRAVENOUS
  Administered 2012-08-23: 125 mL/h via INTRAVENOUS

## 2012-08-23 MED ORDER — DEXAMETHASONE SODIUM PHOSPHATE 4 MG/ML IJ SOLN
INTRAMUSCULAR | Status: DC | PRN
  Administered 2012-08-23: 10 mg via INTRAVENOUS

## 2012-08-23 MED ORDER — ONDANSETRON HCL 4 MG/2ML IJ SOLN
INTRAMUSCULAR | Status: DC | PRN
  Administered 2012-08-23: 4 mg via INTRAVENOUS

## 2012-08-23 SURGICAL SUPPLY — 16 items
CANISTER SUCTION 2500CC (MISCELLANEOUS) ×4 IMPLANT
CATH ROBINSON RED A/P 16FR (CATHETERS) ×2 IMPLANT
CLOTH BEACON ORANGE TIMEOUT ST (SAFETY) ×2 IMPLANT
CONTAINER PREFILL 10% NBF 60ML (FORM) ×5 IMPLANT
DRESSING TELFA 8X3 (GAUZE/BANDAGES/DRESSINGS) ×2 IMPLANT
ELECT REM PT RETURN 9FT ADLT (ELECTROSURGICAL) ×2
ELECTRODE REM PT RTRN 9FT ADLT (ELECTROSURGICAL) IMPLANT
GLOVE BIOGEL PI IND STRL 8.5 (GLOVE) ×1 IMPLANT
GLOVE BIOGEL PI INDICATOR 8.5 (GLOVE) ×1
GLOVE ECLIPSE 8.0 STRL XLNG CF (GLOVE) ×4 IMPLANT
GOWN STRL REIN XL XLG (GOWN DISPOSABLE) ×4 IMPLANT
LOOP ANGLED CUTTING 22FR (CUTTING LOOP) ×1 IMPLANT
PACK HYSTEROSCOPY LF (CUSTOM PROCEDURE TRAY) ×2 IMPLANT
PAD OB MATERNITY 4.3X12.25 (PERSONAL CARE ITEMS) ×2 IMPLANT
TOWEL OR 17X24 6PK STRL BLUE (TOWEL DISPOSABLE) ×4 IMPLANT
WATER STERILE IRR 1000ML POUR (IV SOLUTION) ×2 IMPLANT

## 2012-08-23 NOTE — Anesthesia Preprocedure Evaluation (Signed)
Anesthesia Evaluation  Patient identified by MRN, date of birth, ID band Patient awake    Reviewed: Allergy & Precautions, H&P , NPO status , Patient's Chart, lab work & pertinent test results  Airway Mallampati: II TM Distance: >3 FB Neck ROM: Full    Dental no notable dental hx. (+) Teeth Intact   Pulmonary neg pulmonary ROS,  breath sounds clear to auscultation  Pulmonary exam normal       Cardiovascular hypertension, negative cardio ROS  Rhythm:Regular Rate:Normal     Neuro/Psych negative neurological ROS  negative psych ROS   GI/Hepatic negative GI ROS, Neg liver ROS,   Endo/Other  negative endocrine ROS  Renal/GU negative Renal ROS  negative genitourinary   Musculoskeletal negative musculoskeletal ROS (+)   Abdominal   Peds  Hematology  (+) Blood dyscrasia, anemia ,   Anesthesia Other Findings   Reproductive/Obstetrics Fibroid Uterus                           Anesthesia Physical Anesthesia Plan  ASA: II  Anesthesia Plan: General   Post-op Pain Management:    Induction: Intravenous  Airway Management Planned: LMA  Additional Equipment:   Intra-op Plan:   Post-operative Plan:   Informed Consent: I have reviewed the patients History and Physical, chart, labs and discussed the procedure including the risks, benefits and alternatives for the proposed anesthesia with the patient or authorized representative who has indicated his/her understanding and acceptance.   Dental advisory given  Plan Discussed with: CRNA, Anesthesiologist and Surgeon  Anesthesia Plan Comments:         Anesthesia Quick Evaluation

## 2012-08-23 NOTE — Anesthesia Postprocedure Evaluation (Signed)
  Anesthesia Post-op Note  Patient: Cassie Ward  Procedure(s) Performed: Procedure(s) with comments: DILATATION & CURETTAGE/HYSTEROSCOPY WITH RESECTOCOPE (N/A) - Hysteroscopy with Resection of Submucosal Fibroid; D&C - 60 minutes  Patient Location: PACU  Anesthesia Type:General  Level of Consciousness: awake, alert  and oriented  Airway and Oxygen Therapy: Patient Spontanous Breathing  Post-op Pain: none  Post-op Assessment: Post-op Vital signs reviewed, Patient's Cardiovascular Status Stable, Respiratory Function Stable, Patent Airway, No signs of Nausea or vomiting and Pain level controlled  Post-op Vital Signs: Reviewed and stable  Complications: No apparent anesthesia complications

## 2012-08-23 NOTE — H&P (Signed)
BP 132/86  Pulse 68  Temp(Src) 97.9 F (36.6 C) (Oral)  Resp 16  Ht 5\' 4"  (1.626 m)  Wt 170 lb (77.111 kg)  BMI 29.17 kg/m2  SpO2 100%  LMP 08/17/2012  CBC    Component Value Date/Time   WBC 5.3 08/23/2012 0955   RBC 3.92 08/23/2012 0955   HGB 7.9* 08/23/2012 0955   HCT 26.8* 08/23/2012 0955   PLT 343 08/23/2012 0955   MCV 68.4* 08/23/2012 0955   MCH 20.2* 08/23/2012 0955   MCHC 29.5* 08/23/2012 0955   RDW 19.4* 08/23/2012 0955   LYMPHSABS 1.8 07/09/2012 0220   MONOABS 0.6 07/09/2012 0220   EOSABS 0.2 07/09/2012 0220   BASOSABS 0.0 07/09/2012 0220   The patient was interviewed and examined today.  The previously documented history and physical examination was reviewed. There are no changes. The operative procedure was reviewed. The risks and benefits were outlined again. The specific risks include, but are not limited to, anesthetic complications, bleeding, infections, and possible damage to the surrounding organs. The patient's questions were answered.  We are ready to proceed as outlined. The likelihood of the patient achieving the goals of this procedure is very likely.   Leonard Schwartz, M.D.

## 2012-08-23 NOTE — Transfer of Care (Signed)
Immediate Anesthesia Transfer of Care Note  Patient: Cassie Ward  Procedure(s) Performed: Procedure(s) with comments: DILATATION & CURETTAGE/HYSTEROSCOPY WITH RESECTOCOPE (N/A) - Hysteroscopy with Resection of Submucosal Fibroid; D&C - 60 minutes  Patient Location: PACU  Anesthesia Type:General  Level of Consciousness: awake, alert  and oriented  Airway & Oxygen Therapy: Patient Spontanous Breathing and Patient connected to nasal cannula oxygen  Post-op Assessment: Report given to PACU RN and Post -op Vital signs reviewed and stable  Post vital signs: Reviewed and stable  Complications: No apparent anesthesia complications

## 2012-08-23 NOTE — Op Note (Signed)
OPERATIVE NOTE  Cassie Ward  DOB:    09/09/1970  MRN:    [id number]  CSN:    [id number]  Date of Surgery:  08/23/2012  Preoperative Diagnosis:  Menorrhagia  Dysmenorrhea  Submucosal fibroid  Anemia  Postoperative Diagnosis:  Same  Procedure:  Hysteroscopy with resection of a submucosal fibroid Dilatation and curettage  Surgeon:  Leonard Schwartz, M.D.  Assistant:  None  Anesthetic:  General  Disposition:  The patient is a 42 y.o.-year-old female who presents with a 2.9 cm submucosal fibroids, dysmenorrhea, anemia, and menorrhagia. She understands the indications for her surgical procedure. She accepts the risk of, but not limited to, anesthetic complications, bleeding, infections, and possible damage to the surrounding organs.  Findings:  On examination under anesthesia the uterus was 8 weeks size. No adnexal masses were appreciated. No parametrial disease was appreciated. The uterus sounded to 9 cm. The patient was noted to have a 3 cm submucosal fibroid.  Procedure:  The patient was taken to the operating room where a general anesthetic was given. The perineum and vagina were prepped with Betadine. The bladder was drained of urine. The patient was sterilely draped. Examination under anesthesia was performed. A paracervical block was placed using 10 cc of half percent Marcaine with epinephrine. An endocervical curettage was performed. The cervix was gently dilated. The operative hysteroscope was inserted. Pictures were taken of the submucosal fibroid. The fibroid was then resected using a single loop. The cavity was then curetted using a sharp curet. The cavity was felt to be clean at the end of our procedure. Hemostasis was adequate. All instruments were removed. The examination was repeated and the uterus was noted to be firm. Sponge, and needle counts were correct. The estimated blood loss for the procedure was 100 cc's. The estimated fluid deficit loss 345  cc's. The patient was awakened from her anesthetic without difficulty. She was returned to the supine position and and transported to the recovery room in stable condition. The endocervical curettings, endometrial resections, and endometrial curettings were sent to pathology.  Followup instructions:  The patient will return to see Dr. Stefano Gaul in 2 weeks. She was given a copy of the postoperative instructions for patients who've undergone hysteroscopy.  Discharge medications:  Motrin 800 mg every 8 hours as needed for mild to moderate pain. Vicodin one tablet every 4 hours as needed for severe pain. Phenergan 12.5 mg every 6 hours as needed for nausea.  Leonard Schwartz, M.D.

## 2012-08-24 ENCOUNTER — Encounter (HOSPITAL_COMMUNITY): Payer: Self-pay | Admitting: Obstetrics and Gynecology

## 2012-09-10 ENCOUNTER — Other Ambulatory Visit: Payer: Self-pay | Admitting: Obstetrics and Gynecology

## 2012-09-10 DIAGNOSIS — Z1231 Encounter for screening mammogram for malignant neoplasm of breast: Secondary | ICD-10-CM

## 2012-11-01 ENCOUNTER — Ambulatory Visit
Admission: RE | Admit: 2012-11-01 | Discharge: 2012-11-01 | Disposition: A | Discharge status: Home / Self Care | Payer: 59 | Source: Ambulatory Visit | Attending: Obstetrics and Gynecology | Admitting: Obstetrics and Gynecology

## 2012-11-01 DIAGNOSIS — Z1231 Encounter for screening mammogram for malignant neoplasm of breast: Secondary | ICD-10-CM

## 2013-11-04 ENCOUNTER — Other Ambulatory Visit: Payer: Self-pay

## 2013-11-05 ENCOUNTER — Other Ambulatory Visit: Payer: Self-pay | Admitting: Obstetrics and Gynecology

## 2013-11-05 DIAGNOSIS — N6320 Unspecified lump in the left breast, unspecified quadrant: Secondary | ICD-10-CM

## 2013-11-05 DIAGNOSIS — N644 Mastodynia: Secondary | ICD-10-CM

## 2014-03-10 ENCOUNTER — Encounter (HOSPITAL_COMMUNITY): Payer: Self-pay | Admitting: Obstetrics and Gynecology

## 2014-04-22 ENCOUNTER — Emergency Department (HOSPITAL_COMMUNITY)
Admission: EM | Admit: 2014-04-22 | Discharge: 2014-04-22 | Disposition: A | Discharge status: Home / Self Care | Payer: BC Managed Care – PPO | Attending: Emergency Medicine | Admitting: Emergency Medicine

## 2014-04-22 ENCOUNTER — Encounter (HOSPITAL_COMMUNITY): Payer: Self-pay | Admitting: Emergency Medicine

## 2014-04-22 DIAGNOSIS — I10 Essential (primary) hypertension: Secondary | ICD-10-CM | POA: Diagnosis not present

## 2014-04-22 DIAGNOSIS — T50995A Adverse effect of other drugs, medicaments and biological substances, initial encounter: Secondary | ICD-10-CM | POA: Insufficient documentation

## 2014-04-22 DIAGNOSIS — R5383 Other fatigue: Secondary | ICD-10-CM | POA: Insufficient documentation

## 2014-04-22 DIAGNOSIS — R110 Nausea: Secondary | ICD-10-CM | POA: Diagnosis not present

## 2014-04-22 DIAGNOSIS — Z8742 Personal history of other diseases of the female genital tract: Secondary | ICD-10-CM | POA: Insufficient documentation

## 2014-04-22 DIAGNOSIS — T50905A Adverse effect of unspecified drugs, medicaments and biological substances, initial encounter: Secondary | ICD-10-CM

## 2014-04-22 DIAGNOSIS — R202 Paresthesia of skin: Secondary | ICD-10-CM | POA: Diagnosis not present

## 2014-04-22 DIAGNOSIS — Z79899 Other long term (current) drug therapy: Secondary | ICD-10-CM | POA: Diagnosis not present

## 2014-04-22 DIAGNOSIS — M791 Myalgia: Secondary | ICD-10-CM | POA: Diagnosis not present

## 2014-04-22 DIAGNOSIS — R51 Headache: Secondary | ICD-10-CM | POA: Insufficient documentation

## 2014-04-22 DIAGNOSIS — R531 Weakness: Secondary | ICD-10-CM | POA: Insufficient documentation

## 2014-04-22 DIAGNOSIS — R21 Rash and other nonspecific skin eruption: Secondary | ICD-10-CM | POA: Insufficient documentation

## 2014-04-22 LAB — I-STAT CHEM 8, ED
BUN: 13 mg/dL (ref 6–23)
Calcium, Ion: 1.2 mmol/L (ref 1.12–1.23)
Chloride: 105 mEq/L (ref 96–112)
Creatinine, Ser: 0.9 mg/dL (ref 0.50–1.10)
Glucose, Bld: 99 mg/dL (ref 70–99)
HCT: 37 % (ref 36.0–46.0)
Hemoglobin: 12.6 g/dL (ref 12.0–15.0)
Potassium: 3.4 mEq/L — ABNORMAL LOW (ref 3.7–5.3)
Sodium: 141 mEq/L (ref 137–147)
TCO2: 25 mmol/L (ref 0–100)

## 2014-04-22 MED ORDER — PREDNISONE 20 MG PO TABS
60.0000 mg | ORAL_TABLET | Freq: Once | ORAL | Status: AC
  Administered 2014-04-22: 60 mg via ORAL
  Filled 2014-04-22: qty 3

## 2014-04-22 MED ORDER — IBUPROFEN 800 MG PO TABS
800.0000 mg | ORAL_TABLET | Freq: Once | ORAL | Status: AC
  Administered 2014-04-22: 800 mg via ORAL
  Filled 2014-04-22: qty 1

## 2014-04-22 MED ORDER — DIPHENHYDRAMINE HCL 25 MG PO CAPS
25.0000 mg | ORAL_CAPSULE | Freq: Once | ORAL | Status: AC
  Administered 2014-04-22: 25 mg via ORAL
  Filled 2014-04-22: qty 1

## 2014-04-22 MED ORDER — PREDNISONE 20 MG PO TABS: ORAL_TABLET | ORAL | Status: DC

## 2014-04-22 MED ORDER — DIPHENHYDRAMINE HCL 25 MG PO TABS: 25.0000 mg | ORAL_TABLET | Freq: Four times a day (QID) | ORAL | Status: DC

## 2014-04-22 NOTE — ED Provider Notes (Signed)
CSN: [id number]     Arrival date & time 04/22/14  1439 History   First MD Initiated Contact with Patient 04/22/14 1526     Chief Complaint  Patient presents with  . Allergic Reaction    Started Letrozole over weekend     (Consider location/radiation/quality/duration/timing/severity/associated sxs/prior Treatment) HPI Pt is a 43yo female with hx of fibroids and HTN, presenting to ED with concern for allergic reaction after starting a new fertility medication Femara (letrozole) on Saturday 12/12.  Pt states she started to have lip tingling and "swelling" itchy diffuse rash, mild generalized headache as well as bilateral lower leg swelling and cramping that started last night but she did not think about her new medication being the source until she took her morning dose today.  She reports noticing a fine, diffuse, erythematous pruritic rash today.  She has not taken any medication for her headache or for the rash. Pt reports researching the medication online and is concerned as these are all side effects from the medication and she is also concerned the medication is used in cancer patients.  States she emailed the provider at [facility], Dr. Gladys Damme [place] but states she was told the provider would get back to her. After not hearing back by this afternoon, pt decided to come to ED for further evaluation. Denies any other new medications, food, soaps or lotions. Denies fever, vomiting or diarrhea. Denies chest pain or SOB. Denies difficulty swallowing.  No known allergies.   Past Medical History  Diagnosis Date  . Fibroids 2010  . Hypertension    Past Surgical History  Procedure Laterality Date  . Tonsilectomy/adenoidectomy with myringotomy    . Myomectomy    . Vaginal deliveries  1989  . Dilatation & currettage/hysteroscopy with resectocope N/A 08/23/2012    Procedure: [place];  Surgeon: Ena Dawley, MD;  Location: [facility];  Service: Gynecology;  Laterality: N/A;  Hysteroscopy with Resection of Submucosal Fibroid; D&C - 60 minutes   Family History  Problem Relation Age of Onset  . Cancer Mother     Breast   History  Substance Use Topics  . Smoking status: Never Smoker   . Smokeless tobacco: Never Used  . Alcohol Use: Yes   OB History    Gravida Para Term Preterm AB TAB SAB Ectopic Multiple Living   2 1   1           Review of Systems  Constitutional: Positive for fatigue. Negative for fever and chills.  HENT: Negative for mouth sores, sore throat, trouble swallowing and voice change.   Respiratory: Negative for cough, shortness of breath, wheezing and stridor.   Gastrointestinal: Positive for nausea. Negative for vomiting, abdominal pain, diarrhea and constipation.  Musculoskeletal: Positive for myalgias. Negative for back pain and joint swelling.       Bilateral leg cramping and "tingling"  Skin: Positive for rash. Negative for color change, pallor and wound.  Neurological: Positive for weakness, numbness ( bilateral lower leg "tingling") and headaches. Negative for dizziness and light-headedness.  All other systems reviewed and are negative.     Allergies  Femara  Home Medications   Prior to Admission medications   Medication Sig Start Date End Date Taking? Authorizing Provider  ibuprofen (ADVIL,MOTRIN) 200 MG tablet Take 400 mg by mouth every 6 (six) hours as needed for moderate pain.   Yes Historical Provider, MD  letrozole (FEMARA) 2.5 MG tablet Take 7.5 mg by mouth daily.  Yes Historical Provider, MD  diphenhydrAMINE (BENADRYL) 25 MG tablet Take 1 tablet (25 mg total) by mouth every 6 (six) hours. 04/22/14   Noland Fordyce, PA-C  Hydrocodone-Acetaminophen (VICODIN) 5-300 MG TABS Take 1 tablet by mouth every 4 (four) hours as needed. Patient not taking: Reported on 04/22/2014 08/23/12   Ena Dawley, MD  ibuprofen (ADVIL,MOTRIN) 800 MG tablet Take 1 tablet (800 mg total) by mouth  every 8 (eight) hours as needed for pain. Patient not taking: Reported on 04/22/2014 08/23/12   Ena Dawley, MD  predniSONE (DELTASONE) 20 MG tablet 2 tabs po daily x 3 days 04/22/14   Noland Fordyce, PA-C  promethazine (PHENERGAN) 12.5 MG tablet Take 1 tablet (12.5 mg total) by mouth every 6 (six) hours as needed for nausea. Patient not taking: Reported on 04/22/2014 08/23/12   Ena Dawley, MD   BP 157/91 mmHg  Pulse 68  Temp(Src) 98 F (36.7 C) (Oral)  Resp 18  Wt 163 lb (73.936 kg)  SpO2 100%  LMP 04/16/2014 Physical Exam  Constitutional: She appears well-developed and well-nourished. No distress.  HENT:  Head: Normocephalic and atraumatic.  Mouth/Throat: Uvula is midline, oropharynx is clear and moist and mucous membranes are normal. No oral lesions. No trismus in the jaw. No uvula swelling. No oropharyngeal exudate, posterior oropharyngeal edema, posterior oropharyngeal erythema or tonsillar abscesses.  Eyes: Conjunctivae and EOM are normal. Pupils are equal, round, and reactive to light. No scleral icterus.  Neck: Normal range of motion. Neck supple.  Cardiovascular: Normal rate, regular rhythm and normal heart sounds.   Pulmonary/Chest: Effort normal and breath sounds normal. No respiratory distress. She has no wheezes. She has no rales. She exhibits no tenderness.  No respiratory distress, able to speak in full sentences w/o difficulty. Lungs: CTAB  Abdominal: Soft. Bowel sounds are normal. She exhibits no distension and no mass. There is no tenderness. There is no rebound and no guarding.  Musculoskeletal: Normal range of motion.  Neurological: She is alert.  Skin: Skin is warm and dry. Rash noted. She is not diaphoretic. There is erythema.  Faint erythematous, dry, papular rash on bilateral forearms near antecubital fossa c/w pt scratching area. No bumps or wheals c/w hives noted  Nursing note and vitals reviewed.   ED Course  Procedures (including critical care  time) Labs Review Labs Reviewed  I-STAT CHEM 8, ED - Abnormal; Notable for the following:    Potassium 3.4 (*)    All other components within normal limits    Imaging Review No results found.   EKG Interpretation None      MDM   Final diagnoses:  Medication reaction, initial encounter    Pt is a 43yo female presenting to ED with concern for potential allergic reaction to Femara (letrozole) a fertility medication she was prescribed from a provider at [facility], she started 4 days ago.  She is c/o a mild generalized headache, leg cramping and tingling, lip tingling and "swelling" (no angio edema noted on exam), and diffuse erythematous pruritic rash.    On exam: no respiratory distress, no angioedema, no evidence of urticaria.  Pt does have area of erythematous dried irritated skin on bilateral forearms c/w scratching.  Due to reports of lip tingling and subjective swelling as well as pruritic rash, will give pt benadryl and prednisone in ED and observe.  Will also give ibuprofen for headache.  Due to reported leg tingling and cramping, will check Chem-8.  Pt is hemodynamically stable, BP 148/95,  resp rate of 16 and SpO2 of 100% on RA. Low for anaphylaxis as this time.    Chem-8: unremarkable.   Discussed pt with Dr. Aline Brochure who also examined pt.  Pt is hemodynamically stable for discharged. Rx: benadryl and prednisone x3 days for pruritic rash.  Advised to discontinue use of her new medication until she f/u with the provider who prescribed it.  Return precautions provided. Pt verbalized understanding and agreement with tx plan.    Noland Fordyce, PA-C 04/22/14 [employer], MD 04/24/14 1311

## 2014-04-22 NOTE — ED Notes (Addendum)
Pt reports starting Femara Saturday. Pt continues to reports noted small bumps on neck and elbows, tingling in legs, lip swelling today, and generalized weakness. Pt denies SOB.  Pt reports looked up symptoms of medication and those she describes as having are side effects. Pt also states this drug is used for cancer patients and is concerned.   Upon assessment redness noted on elbows bilaterally from pt itching site. No bumps or raised areas noted.

## 2014-04-25 ENCOUNTER — Ambulatory Visit (INDEPENDENT_AMBULATORY_CARE_PROVIDER_SITE_OTHER): Payer: BC Managed Care – PPO | Admitting: Licensed Clinical Social Worker

## 2014-04-25 DIAGNOSIS — F411 Generalized anxiety disorder: Secondary | ICD-10-CM

## 2014-04-30 ENCOUNTER — Ambulatory Visit: Payer: BC Managed Care – PPO | Admitting: Licensed Clinical Social Worker

## 2014-06-29 ENCOUNTER — Emergency Department (HOSPITAL_COMMUNITY): Payer: BLUE CROSS/BLUE SHIELD

## 2014-06-29 ENCOUNTER — Emergency Department (HOSPITAL_COMMUNITY)
Admission: EM | Admit: 2014-06-29 | Discharge: 2014-06-29 | Disposition: A | Discharge status: Home / Self Care | Payer: BLUE CROSS/BLUE SHIELD | Attending: Emergency Medicine | Admitting: Emergency Medicine

## 2014-06-29 ENCOUNTER — Encounter (HOSPITAL_COMMUNITY): Payer: Self-pay

## 2014-06-29 DIAGNOSIS — Z8742 Personal history of other diseases of the female genital tract: Secondary | ICD-10-CM | POA: Diagnosis not present

## 2014-06-29 DIAGNOSIS — Z3202 Encounter for pregnancy test, result negative: Secondary | ICD-10-CM | POA: Insufficient documentation

## 2014-06-29 DIAGNOSIS — G4489 Other headache syndrome: Secondary | ICD-10-CM | POA: Diagnosis not present

## 2014-06-29 DIAGNOSIS — Z79899 Other long term (current) drug therapy: Secondary | ICD-10-CM | POA: Insufficient documentation

## 2014-06-29 DIAGNOSIS — R0981 Nasal congestion: Secondary | ICD-10-CM | POA: Diagnosis not present

## 2014-06-29 DIAGNOSIS — Z7952 Long term (current) use of systemic steroids: Secondary | ICD-10-CM | POA: Diagnosis not present

## 2014-06-29 DIAGNOSIS — I10 Essential (primary) hypertension: Secondary | ICD-10-CM | POA: Insufficient documentation

## 2014-06-29 DIAGNOSIS — R51 Headache: Secondary | ICD-10-CM | POA: Diagnosis present

## 2014-06-29 LAB — BASIC METABOLIC PANEL
Anion gap: 8 (ref 5–15)
BUN: 12 mg/dL (ref 6–23)
CO2: 22 mmol/L (ref 19–32)
Calcium: 9.4 mg/dL (ref 8.4–10.5)
Chloride: 104 mmol/L (ref 96–112)
Creatinine, Ser: 0.8 mg/dL (ref 0.50–1.10)
GFR calc Af Amer: >90 mL/min (ref >90)
GFR calc non Af Amer: 88 mL/min — ABNORMAL LOW (ref >90)
Glucose, Bld: 117 mg/dL — ABNORMAL HIGH (ref 70–99)
Potassium: 4.1 mmol/L (ref 3.5–5.1)
Sodium: 134 mmol/L — ABNORMAL LOW (ref 135–145)

## 2014-06-29 LAB — CBC WITH DIFFERENTIAL/PLATELET
Basophils Absolute: 0 10*3/uL (ref 0.0–0.1)
Basophils Relative: 1 % (ref 0–1)
Eosinophils Absolute: 0.1 10*3/uL (ref 0.0–0.7)
Eosinophils Relative: 2 % (ref 0–5)
HCT: 37.1 % (ref 36.0–46.0)
Hemoglobin: 12 g/dL (ref 12.0–15.0)
Lymphocytes Relative: 26 % (ref 12–46)
Lymphs Abs: 1.9 10*3/uL (ref 0.7–4.0)
MCH: 26.1 pg (ref 26.0–34.0)
MCHC: 32.3 g/dL (ref 30.0–36.0)
MCV: 80.8 fL (ref 78.0–100.0)
Monocytes Absolute: 0.5 10*3/uL (ref 0.1–1.0)
Monocytes Relative: 8 % (ref 3–12)
Neutro Abs: 4.5 10*3/uL (ref 1.7–7.7)
Neutrophils Relative %: 63 % (ref 43–77)
Platelets: 325 10*3/uL (ref 150–400)
RBC: 4.59 MIL/uL (ref 3.87–5.11)
RDW: 17.1 % — ABNORMAL HIGH (ref 11.5–15.5)
WBC: 7.1 10*3/uL (ref 4.0–10.5)

## 2014-06-29 LAB — I-STAT BETA HCG BLOOD, ED (MC, WL, AP ONLY): I-stat hCG, quantitative: <5 m[IU]/mL (ref <5)

## 2014-06-29 MED ORDER — KETOROLAC TROMETHAMINE 30 MG/ML IJ SOLN
30.0000 mg | Freq: Once | INTRAMUSCULAR | Status: AC
  Administered 2014-06-29: 30 mg via INTRAVENOUS
  Filled 2014-06-29: qty 1

## 2014-06-29 MED ORDER — PROCHLORPERAZINE EDISYLATE 5 MG/ML IJ SOLN
10.0000 mg | Freq: Once | INTRAMUSCULAR | Status: AC
  Administered 2014-06-29: 10 mg via INTRAVENOUS
  Filled 2014-06-29: qty 2

## 2014-06-29 MED ORDER — SODIUM CHLORIDE 0.9 % IV SOLN
INTRAVENOUS | Status: DC
  Administered 2014-06-29: 10:00:00 via INTRAVENOUS

## 2014-06-29 MED ORDER — DIPHENHYDRAMINE HCL 50 MG/ML IJ SOLN
12.5000 mg | Freq: Once | INTRAMUSCULAR | Status: AC
  Administered 2014-06-29: 12.5 mg via INTRAVENOUS
  Filled 2014-06-29: qty 1

## 2014-06-29 NOTE — ED Notes (Signed)
Pt alert, oriented, and ambulatory upon DC. She was advised to follow up with PCP in 1 week.

## 2014-06-29 NOTE — ED Notes (Signed)
Pt and family made aware they are awaiting MD Eulis Foster to review results with them.

## 2014-06-29 NOTE — Discharge Instructions (Signed)
Use an over-the-counter medication like Tylenol or Motrin, for pain.   General Headache Without Cause A headache is pain or discomfort felt around the head or neck area. The specific cause of a headache may not be found. There are many causes and types of headaches. A few common ones are:  Tension headaches.  Migraine headaches.  Cluster headaches.  Chronic daily headaches. HOME CARE INSTRUCTIONS   Keep all follow-up appointments with your caregiver or any specialist referral.  Only take over-the-counter or prescription medicines for pain or discomfort as directed by your caregiver.  Lie down in a dark, quiet room when you have a headache.  Keep a headache journal to find out what may trigger your migraine headaches. For example, write down:  What you eat and drink.  How much sleep you get.  Any change to your diet or medicines.  Try massage or other relaxation techniques.  Put ice packs or heat on the head and neck. Use these 3 to 4 times per day for 15 to 20 minutes each time, or as needed.  Limit stress.  Sit up straight, and do not tense your muscles.  Quit smoking if you smoke.  Limit alcohol use.  Decrease the amount of caffeine you drink, or stop drinking caffeine.  Eat and sleep on a regular schedule.  Get 7 to 9 hours of sleep, or as recommended by your caregiver.  Keep lights dim if bright lights bother you and make your headaches worse. SEEK MEDICAL CARE IF:   You have problems with the medicines you were prescribed.  Your medicines are not working.  You have a change from the usual headache.  You have nausea or vomiting. SEEK IMMEDIATE MEDICAL CARE IF:   Your headache becomes severe.  You have a fever.  You have a stiff neck.  You have loss of vision.  You have muscular weakness or loss of muscle control.  You start losing your balance or have trouble walking.  You feel faint or pass out.  You have severe symptoms that are different  from your first symptoms. MAKE SURE YOU:   Understand these instructions.  Will watch your condition.  Will get help right away if you are not doing well or get worse. Document Released: 04/25/2005 Document Revised: 07/18/2011 Document Reviewed: 05/11/2011 [facility] Patient Information 2015 [place], [place]. This information is not intended to replace advice given to you by your health care provider. Make sure you discuss any questions you have with your health care provider.

## 2014-06-29 NOTE — ED Provider Notes (Signed)
CSN: [id number]     Arrival date & time 06/29/14  0906 History   First MD Initiated Contact with Patient 06/29/14 0915     Chief Complaint  Patient presents with  . Headache  . sinus pressure      (Consider location/radiation/quality/duration/timing/severity/associated sxs/prior Treatment) HPI   Cassie Ward is a 44 y.o. female who presents for evaluation of headache which started yesterday.  2 weeks ago she had some sinus symptoms and was treated with a nasal steroid, with reported improvement of her sinus symptoms.  She denies current fever, chills, nausea, vomiting, ear pain, sore throat, chest pain or paresthesias.  She states when she passes gas her head hurts more; also movement of her neck causes head pain.  She denies stress.  She had in vitro fertilization of couple of weeks ago, trying to get pregnant.  She does not have chronic headache problems.  There are no other known modifying factors.   Past Medical History  Diagnosis Date  . Fibroids 2010  . Hypertension    Past Surgical History  Procedure Laterality Date  . Tonsilectomy/adenoidectomy with myringotomy    . Myomectomy    . Vaginal deliveries  1989  . Dilatation & currettage/hysteroscopy with resectocope N/A 08/23/2012    Procedure: [place];  Surgeon: Ena Dawley, MD;  Location: [facility];  Service: Gynecology;  Laterality: N/A;  Hysteroscopy with Resection of Submucosal Fibroid; D&C - 60 minutes   Family History  Problem Relation Age of Onset  . Cancer Mother     Breast   History  Substance Use Topics  . Smoking status: Never Smoker   . Smokeless tobacco: Never Used  . Alcohol Use: Yes   OB History    Gravida Para Term Preterm AB TAB SAB Ectopic Multiple Living   2 1   1           Review of Systems  All other systems reviewed and are negative.     Allergies  Review of patient's allergies indicates no known allergies.  Home Medications   Prior to  Admission medications   Medication Sig Start Date End Date Taking? Authorizing Provider  Choriogonadotropin Alfa 250 MCG/0.5ML injection Inject 250 mcg into the skin daily as needed (for fertilaty).   Yes Historical Provider, MD  Dextromethorphan-Guaifenesin 5-100 MG/5ML LIQD Take 20 mLs by mouth 2 (two) times daily as needed (for cough).   Yes Historical Provider, MD  naproxen sodium (ANAPROX) 220 MG tablet Take 220 mg by mouth 2 (two) times daily as needed (for pain).   Yes Historical Provider, MD  Prenatal Vit-Fe Fumarate-FA (MULTIVITAMIN-PRENATAL) 27-0.8 MG TABS tablet Take 1 tablet by mouth daily.   Yes Historical Provider, MD  diphenhydrAMINE (BENADRYL) 25 MG tablet Take 1 tablet (25 mg total) by mouth every 6 (six) hours. Patient not taking: Reported on 06/29/2014 04/22/14   Noland Fordyce, PA-C  Hydrocodone-Acetaminophen (VICODIN) 5-300 MG TABS Take 1 tablet by mouth every 4 (four) hours as needed. Patient not taking: Reported on 04/22/2014 08/23/12   Ena Dawley, MD  ibuprofen (ADVIL,MOTRIN) 800 MG tablet Take 1 tablet (800 mg total) by mouth every 8 (eight) hours as needed for pain. Patient not taking: Reported on 04/22/2014 08/23/12   Ena Dawley, MD  predniSONE (DELTASONE) 20 MG tablet 2 tabs po daily x 3 days Patient not taking: Reported on 06/29/2014 04/22/14   Noland Fordyce, PA-C  promethazine (PHENERGAN) 12.5 MG tablet Take 1 tablet (12.5 mg total) by mouth every  6 (six) hours as needed for nausea. Patient not taking: Reported on 04/22/2014 08/23/12   Ena Dawley, MD   BP 124/71 mmHg  Pulse 75  Temp(Src) 99.7 F (37.6 C) (Oral)  Resp 16  SpO2 97%  LMP 06/10/2014 Physical Exam  Constitutional: She is oriented to person, place, and time. She appears well-developed and well-nourished.  HENT:  Head: Normocephalic and atraumatic.  Right Ear: External ear normal.  Left Ear: External ear normal.  Eyes: Conjunctivae and EOM are normal. Pupils are equal, round, and reactive  to light.  Neck: Normal range of motion and phonation normal. Neck supple.  No meningismus  Cardiovascular: Normal rate, regular rhythm and normal heart sounds.   Pulmonary/Chest: Effort normal and breath sounds normal. She exhibits no bony tenderness.  Abdominal: Soft. There is no tenderness.  Musculoskeletal: Normal range of motion.  Tender trapezius bilaterally.  Neurological: She is alert and oriented to person, place, and time. No cranial nerve deficit or sensory deficit. She exhibits normal muscle tone. Coordination normal.  No dysarthria and aphasia or nystagmus.  Normal gait.  Romberg negative.  Skin: Skin is warm, dry and intact.  Psychiatric: She has a normal mood and affect. Her behavior is normal. Judgment and thought content normal.  Nursing note and vitals reviewed.   ED Course  Procedures (including critical care time)  Medications  0.9 %  sodium chloride infusion ( Intravenous New Bag/Given 06/29/14 1001)  ketorolac (TORADOL) 30 MG/ML injection 30 mg (30 mg Intravenous Given 06/29/14 1002)  prochlorperazine (COMPAZINE) injection 10 mg (10 mg Intravenous Given 06/29/14 1003)  diphenhydrAMINE (BENADRYL) injection 12.5 mg (12.5 mg Intravenous Given 06/29/14 1000)    Patient Vitals for the past 24 hrs:  BP Temp Temp src Pulse Resp SpO2  06/29/14 1214 124/71 mmHg - - 75 16 97 %  06/29/14 0916 (!) 150/104 mmHg 99.7 F (37.6 C) Oral 93 18 100 %    10:59 AM Reevaluation with update and discussion. After initial assessment and treatment, an updated evaluation reveals she states her headache is gone, except when she "shakes my head."  Findings discussed with patient.  CT head ordered.Daleen Bo L    Labs Review Labs Reviewed  CBC WITH DIFFERENTIAL/PLATELET - Abnormal; Notable for the following:    RDW 17.1 (*)    All other components within normal limits  BASIC METABOLIC PANEL - Abnormal; Notable for the following:    Sodium 134 (*)    Glucose, Bld 117 (*)    GFR  calc non Af Amer 88 (*)    All other components within normal limits  I-STAT BETA HCG BLOOD, ED ([facility], [facility], [facility] ONLY)    Imaging Review Ct Head Wo Contrast  06/29/2014   CLINICAL DATA:  Headache starting yesterday  EXAM: CT HEAD WITHOUT CONTRAST  TECHNIQUE: Contiguous axial images were obtained from the base of the skull through the vertex without intravenous contrast.  COMPARISON:  None.  FINDINGS: No skull fracture is noted. Paranasal sinuses and mastoid air cells are unremarkable. No intracranial hemorrhage, mass effect or midline shift.  No hydrocephalus. No intra or extra-axial fluid collection. The gray and white-matter differentiation is preserved. No acute cortical infarction. No mass lesion is noted on this unenhanced scan.  IMPRESSION: No acute intracranial abnormality.   Electronically Signed   By: Lahoma Crocker M.D.   On: 06/29/2014 11:43     EKG Interpretation None      MDM   Final diagnoses:  Other headache syndrome  Nonspecific headache; doubt subarachnoid hemorrhage, meningitis, sinusitis, or cervical neuropathy.  Nursing Notes Reviewed/ Care Coordinated Applicable Imaging Reviewed Interpretation of Laboratory Data incorporated into ED treatment  The patient appears reasonably screened and/or stabilized for discharge and I doubt any other medical condition or other [facility] requiring further screening, evaluation, or treatment in the ED at this time prior to discharge.  Plan: Home Medications- OTC analgesia; Home Treatments- rest; return here if the recommended treatment, does not improve the symptoms; Recommended follow up- PCP, when necessary     Richarda Blade, MD 06/29/14 [phone]

## 2014-06-29 NOTE — ED Notes (Signed)
Pt sensitive to light.

## 2014-06-29 NOTE — ED Notes (Signed)
Pt with headache since yesterday.  Pt has sinus on/off recently.  Pt with hx of migraines but states this feels differently.  Pt has slight fever 99.7

## 2014-06-29 NOTE — ED Notes (Signed)
Pt ambulated to restroom with steady gait.

## 2015-03-23 ENCOUNTER — Other Ambulatory Visit: Payer: Self-pay | Admitting: Obstetrics and Gynecology

## 2015-03-23 DIAGNOSIS — N644 Mastodynia: Secondary | ICD-10-CM

## 2015-04-07 ENCOUNTER — Other Ambulatory Visit: Payer: BLUE CROSS/BLUE SHIELD

## 2015-04-08 ENCOUNTER — Other Ambulatory Visit: Payer: Self-pay | Admitting: Obstetrics and Gynecology

## 2015-04-14 ENCOUNTER — Other Ambulatory Visit: Payer: Self-pay | Admitting: Obstetrics and Gynecology

## 2015-04-15 ENCOUNTER — Encounter (HOSPITAL_BASED_OUTPATIENT_CLINIC_OR_DEPARTMENT_OTHER): Payer: Self-pay | Admitting: Emergency Medicine

## 2015-04-15 DIAGNOSIS — R102 Pelvic and perineal pain: Secondary | ICD-10-CM | POA: Diagnosis not present

## 2015-04-15 DIAGNOSIS — I10 Essential (primary) hypertension: Secondary | ICD-10-CM | POA: Insufficient documentation

## 2015-04-15 NOTE — ED Notes (Signed)
Pt states has history of fibroids. Scheduled for surgery on Dec 20th. Pt states her menstrual cycle is about to start and pain is unbearable.

## 2015-04-16 ENCOUNTER — Emergency Department (HOSPITAL_BASED_OUTPATIENT_CLINIC_OR_DEPARTMENT_OTHER)
Admission: EM | Admit: 2015-04-16 | Discharge: 2015-04-16 | Discharge status: Against Medical Advice | Payer: Managed Care, Other (non HMO) | Attending: Emergency Medicine | Admitting: Emergency Medicine

## 2015-04-16 NOTE — ED Notes (Signed)
Pt's room is empty and was informed by EMT that pt walked out.

## 2015-04-20 NOTE — Patient Instructions (Addendum)
Your procedure is scheduled on:  Tuesday, Dec. 20, 2016  Enter through the [employer] [facility] at:  12:15 P.M.  Pick up the phone at the desk and dial [phone].  Call this number if you have problems the morning of surgery: [phone].  Remember: Do NOT eat food:  After Midnight Monday, Dec. 19, 2016 Do NOT drink clear liquids after:  8:00 A.M. Day of surgery Take these medicines the morning of surgery with a SIP OF WATER:  Lisinopril  Do NOT wear jewelry (body piercing), metal hair clips/bobby pins, make-up, or nail polish. Do NOT wear lotions, powders, or perfumes.  You may wear deoderant. Do NOT shave for 48 hours prior to surgery. Do NOT bring valuables to the hospital. Contacts, dentures, or bridgework may not be worn into surgery. Leave suitcase in car.  After surgery it may be brought to your room.  For patients admitted to the hospital, checkout time is 11:00 AM the day of discharge.

## 2015-04-21 ENCOUNTER — Other Ambulatory Visit: Payer: Self-pay

## 2015-04-21 ENCOUNTER — Encounter (HOSPITAL_COMMUNITY): Payer: Self-pay

## 2015-04-21 ENCOUNTER — Encounter (HOSPITAL_COMMUNITY)
Admission: RE | Admit: 2015-04-21 | Discharge: 2015-04-21 | Disposition: A | Discharge status: Home / Self Care | Payer: Managed Care, Other (non HMO) | Source: Ambulatory Visit | Attending: Obstetrics and Gynecology | Admitting: Obstetrics and Gynecology

## 2015-04-21 DIAGNOSIS — Z01818 Encounter for other preprocedural examination: Secondary | ICD-10-CM | POA: Diagnosis not present

## 2015-04-21 DIAGNOSIS — D259 Leiomyoma of uterus, unspecified: Secondary | ICD-10-CM | POA: Insufficient documentation

## 2015-04-21 DIAGNOSIS — N920 Excessive and frequent menstruation with regular cycle: Secondary | ICD-10-CM | POA: Diagnosis not present

## 2015-04-21 DIAGNOSIS — R102 Pelvic and perineal pain: Secondary | ICD-10-CM | POA: Diagnosis not present

## 2015-04-21 DIAGNOSIS — D649 Anemia, unspecified: Secondary | ICD-10-CM | POA: Insufficient documentation

## 2015-04-21 HISTORY — DX: Anemia, unspecified: D64.9

## 2015-04-21 HISTORY — DX: Cardiac arrhythmia, unspecified: I49.9

## 2015-04-21 HISTORY — DX: Headache: R51

## 2015-04-21 HISTORY — DX: Anxiety disorder, unspecified: F41.9

## 2015-04-21 HISTORY — DX: Headache, unspecified: R51.9

## 2015-04-21 LAB — TYPE AND SCREEN
ABO/RH(D): A POS
Antibody Screen: NEGATIVE

## 2015-04-21 LAB — BASIC METABOLIC PANEL
Anion gap: 7 (ref 5–15)
BUN: 11 mg/dL (ref 6–20)
CO2: 29 mmol/L (ref 22–32)
Calcium: 9.3 mg/dL (ref 8.9–10.3)
Chloride: 102 mmol/L (ref 101–111)
Creatinine, Ser: 0.79 mg/dL (ref 0.44–1.00)
GFR calc Af Amer: >60 mL/min (ref >60)
GFR calc non Af Amer: >60 mL/min (ref >60)
Glucose, Bld: 101 mg/dL — ABNORMAL HIGH (ref 65–99)
Potassium: 3.6 mmol/L (ref 3.5–5.1)
Sodium: 138 mmol/L (ref 135–145)

## 2015-04-21 LAB — CBC
HCT: 33.4 % — ABNORMAL LOW (ref 36.0–46.0)
Hemoglobin: 10.2 g/dL — ABNORMAL LOW (ref 12.0–15.0)
MCH: 23.9 pg — ABNORMAL LOW (ref 26.0–34.0)
MCHC: 30.5 g/dL (ref 30.0–36.0)
MCV: 78.2 fL (ref 78.0–100.0)
Platelets: 297 10*3/uL (ref 150–400)
RBC: 4.27 MIL/uL (ref 3.87–5.11)
RDW: 17.4 % — ABNORMAL HIGH (ref 11.5–15.5)
WBC: 4.7 10*3/uL (ref 4.0–10.5)

## 2015-04-21 LAB — ABO/RH: ABO/RH(D): A POS

## 2015-04-28 ENCOUNTER — Encounter (HOSPITAL_COMMUNITY): Payer: Self-pay | Admitting: *Deleted

## 2015-04-28 ENCOUNTER — Encounter (HOSPITAL_COMMUNITY)
Admission: RE | Disposition: A | Discharge status: Home / Self Care | Payer: Self-pay | Source: Ambulatory Visit | Attending: Obstetrics and Gynecology

## 2015-04-28 ENCOUNTER — Inpatient Hospital Stay (HOSPITAL_COMMUNITY)
Admission: RE | Admit: 2015-04-28 | Discharge: 2015-04-30 | DRG: 743 | Disposition: A | Discharge status: Home / Self Care | Payer: Managed Care, Other (non HMO) | Source: Ambulatory Visit | Attending: Obstetrics and Gynecology | Admitting: Obstetrics and Gynecology

## 2015-04-28 ENCOUNTER — Inpatient Hospital Stay (HOSPITAL_COMMUNITY): Payer: Managed Care, Other (non HMO) | Admitting: Anesthesiology

## 2015-04-28 DIAGNOSIS — D649 Anemia, unspecified: Secondary | ICD-10-CM | POA: Diagnosis present

## 2015-04-28 DIAGNOSIS — E669 Obesity, unspecified: Secondary | ICD-10-CM | POA: Diagnosis present

## 2015-04-28 DIAGNOSIS — Z6830 Body mass index (BMI) 30.0-30.9, adult: Secondary | ICD-10-CM | POA: Diagnosis not present

## 2015-04-28 DIAGNOSIS — N920 Excessive and frequent menstruation with regular cycle: Secondary | ICD-10-CM | POA: Diagnosis present

## 2015-04-28 DIAGNOSIS — I10 Essential (primary) hypertension: Secondary | ICD-10-CM | POA: Diagnosis present

## 2015-04-28 DIAGNOSIS — N979 Female infertility, unspecified: Secondary | ICD-10-CM | POA: Diagnosis present

## 2015-04-28 DIAGNOSIS — N946 Dysmenorrhea, unspecified: Secondary | ICD-10-CM | POA: Diagnosis present

## 2015-04-28 DIAGNOSIS — D250 Submucous leiomyoma of uterus: Principal | ICD-10-CM | POA: Diagnosis present

## 2015-04-28 DIAGNOSIS — D259 Leiomyoma of uterus, unspecified: Secondary | ICD-10-CM | POA: Diagnosis present

## 2015-04-28 HISTORY — PX: MYOMECTOMY: SHX85

## 2015-04-28 LAB — PREGNANCY, URINE: Preg Test, Ur: NEGATIVE

## 2015-04-28 SURGERY — MYOMECTOMY, ABDOMINAL APPROACH
Anesthesia: General | Site: Abdomen

## 2015-04-28 MED ORDER — DIPHENHYDRAMINE HCL 12.5 MG/5ML PO ELIX: 12.5000 mg | ORAL_SOLUTION | Freq: Four times a day (QID) | ORAL | Status: DC | PRN

## 2015-04-28 MED ORDER — LACTATED RINGERS IV SOLN
INTRAVENOUS | Status: DC
  Administered 2015-04-28: 16:00:00 via INTRAVENOUS
  Administered 2015-04-28: 125 mL/h via INTRAVENOUS
  Administered 2015-04-28 (×2): via INTRAVENOUS

## 2015-04-28 MED ORDER — ONDANSETRON HCL 4 MG/2ML IJ SOLN
INTRAMUSCULAR | Status: DC | PRN
  Administered 2015-04-28: 4 mg via INTRAVENOUS

## 2015-04-28 MED ORDER — GLYCOPYRROLATE 0.2 MG/ML IJ SOLN
INTRAMUSCULAR | Status: DC | PRN
  Administered 2015-04-28: 0.6 mg via INTRAVENOUS

## 2015-04-28 MED ORDER — FENTANYL CITRATE (PF) 250 MCG/5ML IJ SOLN
INTRAMUSCULAR | Status: AC
  Filled 2015-04-28: qty 10

## 2015-04-28 MED ORDER — CEFAZOLIN SODIUM-DEXTROSE 2-3 GM-% IV SOLR
2.0000 g | Freq: Once | INTRAVENOUS | Status: AC
  Administered 2015-04-28: 2 g via INTRAVENOUS

## 2015-04-28 MED ORDER — HYDROMORPHONE 1 MG/ML IV SOLN
INTRAVENOUS | Status: DC
  Administered 2015-04-28: 21:00:00 via INTRAVENOUS
  Administered 2015-04-28: 0.9 mg via INTRAVENOUS
  Administered 2015-04-29: 0.6 mg via INTRAVENOUS
  Administered 2015-04-29: 2.1 mg via INTRAVENOUS
  Administered 2015-04-29: 1.8 mg via INTRAVENOUS
  Administered 2015-04-29: 0 mg via INTRAVENOUS
  Filled 2015-04-28: qty 25

## 2015-04-28 MED ORDER — HYDROCHLOROTHIAZIDE 12.5 MG PO CAPS
12.5000 mg | ORAL_CAPSULE | Freq: Every day | ORAL | Status: DC
  Administered 2015-04-29 – 2015-04-30 (×2): 12.5 mg via ORAL
  Filled 2015-04-28 (×2): qty 1

## 2015-04-28 MED ORDER — KETOROLAC TROMETHAMINE 30 MG/ML IJ SOLN
INTRAMUSCULAR | Status: AC
  Filled 2015-04-28: qty 1

## 2015-04-28 MED ORDER — HYDROMORPHONE HCL 1 MG/ML IJ SOLN
INTRAMUSCULAR | Status: AC
  Filled 2015-04-28: qty 1

## 2015-04-28 MED ORDER — DIPHENHYDRAMINE HCL 50 MG/ML IJ SOLN: 12.5000 mg | Freq: Four times a day (QID) | INTRAMUSCULAR | Status: DC | PRN

## 2015-04-28 MED ORDER — LISINOPRIL 10 MG PO TABS
10.0000 mg | ORAL_TABLET | Freq: Every day | ORAL | Status: DC
Start: 2015-04-29 — End: 2015-04-30
  Administered 2015-04-29 – 2015-04-30 (×2): 10 mg via ORAL
  Filled 2015-04-28 (×2): qty 1

## 2015-04-28 MED ORDER — POLYSACCHARIDE IRON COMPLEX 150 MG PO CAPS
150.0000 mg | ORAL_CAPSULE | Freq: Two times a day (BID) | ORAL | Status: DC
  Administered 2015-04-29 – 2015-04-30 (×3): 150 mg via ORAL
  Filled 2015-04-28 (×5): qty 1

## 2015-04-28 MED ORDER — SCOPOLAMINE 1 MG/3DAYS TD PT72
1.0000 | MEDICATED_PATCH | Freq: Once | TRANSDERMAL | Status: DC
  Administered 2015-04-28: 1.5 mg via TRANSDERMAL

## 2015-04-28 MED ORDER — DOCUSATE SODIUM 100 MG PO CAPS
100.0000 mg | ORAL_CAPSULE | Freq: Two times a day (BID) | ORAL | Status: DC
  Administered 2015-04-29 – 2015-04-30 (×3): 100 mg via ORAL
  Filled 2015-04-28 (×3): qty 1

## 2015-04-28 MED ORDER — ARTIFICIAL TEARS OP OINT
TOPICAL_OINTMENT | OPHTHALMIC | Status: DC | PRN
  Administered 2015-04-28: 1 via OPHTHALMIC

## 2015-04-28 MED ORDER — BUPIVACAINE-EPINEPHRINE (PF) 0.5% -1:200000 IJ SOLN
INTRAMUSCULAR | Status: AC
  Filled 2015-04-28: qty 30

## 2015-04-28 MED ORDER — VASOPRESSIN 20 UNIT/ML IV SOLN
INTRAVENOUS | Status: AC
  Filled 2015-04-28: qty 1

## 2015-04-28 MED ORDER — LISINOPRIL-HYDROCHLOROTHIAZIDE 10-12.5 MG PO TABS: 1.0000 | ORAL_TABLET | Freq: Every day | ORAL | Status: DC

## 2015-04-28 MED ORDER — NEOSTIGMINE METHYLSULFATE 10 MG/10ML IV SOLN
INTRAVENOUS | Status: DC | PRN
  Administered 2015-04-28: 3 mg via INTRAVENOUS

## 2015-04-28 MED ORDER — HYDROMORPHONE HCL 1 MG/ML IJ SOLN
INTRAMUSCULAR | Status: DC | PRN
  Administered 2015-04-28 (×2): 0.5 mg via INTRAVENOUS

## 2015-04-28 MED ORDER — HEPARIN SODIUM (PORCINE) 5000 UNIT/ML IJ SOLN
INTRAMUSCULAR | Status: AC
Start: 2015-04-28 — End: 2015-04-28
  Filled 2015-04-28: qty 1

## 2015-04-28 MED ORDER — LIDOCAINE HCL (CARDIAC) 20 MG/ML IV SOLN
INTRAVENOUS | Status: DC | PRN
  Administered 2015-04-28: 50 mg via INTRAVENOUS

## 2015-04-28 MED ORDER — LACTATED RINGERS IV SOLN
INTRAVENOUS | Status: DC
  Administered 2015-04-29: 02:00:00 via INTRAVENOUS

## 2015-04-28 MED ORDER — MIDAZOLAM HCL 5 MG/5ML IJ SOLN
INTRAMUSCULAR | Status: DC | PRN
  Administered 2015-04-28: 2 mg via INTRAVENOUS

## 2015-04-28 MED ORDER — ONDANSETRON HCL 4 MG/2ML IJ SOLN: 4.0000 mg | Freq: Four times a day (QID) | INTRAMUSCULAR | Status: DC | PRN

## 2015-04-28 MED ORDER — SODIUM CHLORIDE 0.9 % IJ SOLN
INTRAMUSCULAR | Status: AC
  Filled 2015-04-28: qty 100

## 2015-04-28 MED ORDER — NALOXONE HCL 0.4 MG/ML IJ SOLN: 0.4000 mg | INTRAMUSCULAR | Status: DC | PRN

## 2015-04-28 MED ORDER — OXYCODONE-ACETAMINOPHEN 5-325 MG PO TABS
1.0000 | ORAL_TABLET | ORAL | Status: DC | PRN
  Administered 2015-04-29 – 2015-04-30 (×3): 1 via ORAL
  Filled 2015-04-28 (×3): qty 1

## 2015-04-28 MED ORDER — ONDANSETRON HCL 4 MG PO TABS: 4.0000 mg | ORAL_TABLET | Freq: Three times a day (TID) | ORAL | Status: DC | PRN

## 2015-04-28 MED ORDER — CEFAZOLIN SODIUM-DEXTROSE 2-3 GM-% IV SOLR
INTRAVENOUS | Status: AC
  Filled 2015-04-28: qty 50

## 2015-04-28 MED ORDER — HYDROMORPHONE HCL 1 MG/ML IJ SOLN
INTRAMUSCULAR | Status: AC
  Administered 2015-04-28: 0.5 mg via INTRAVENOUS
  Filled 2015-04-28: qty 1

## 2015-04-28 MED ORDER — HYDROMORPHONE HCL 1 MG/ML IJ SOLN
0.2500 mg | INTRAMUSCULAR | Status: DC | PRN
Start: 2015-04-28 — End: 2015-04-28
  Administered 2015-04-28 (×4): 0.5 mg via INTRAVENOUS

## 2015-04-28 MED ORDER — KETOROLAC TROMETHAMINE 30 MG/ML IJ SOLN
INTRAMUSCULAR | Status: DC | PRN
  Administered 2015-04-28: 30 mg via INTRAVENOUS

## 2015-04-28 MED ORDER — SCOPOLAMINE 1 MG/3DAYS TD PT72
MEDICATED_PATCH | TRANSDERMAL | Status: AC
  Administered 2015-04-28: 1.5 mg via TRANSDERMAL
  Filled 2015-04-28: qty 1

## 2015-04-28 MED ORDER — SODIUM CHLORIDE 0.9 % IJ SOLN: 9.0000 mL | INTRAMUSCULAR | Status: DC | PRN

## 2015-04-28 MED ORDER — KETOROLAC TROMETHAMINE 30 MG/ML IJ SOLN
30.0000 mg | Freq: Four times a day (QID) | INTRAMUSCULAR | Status: AC
  Administered 2015-04-29 (×3): 30 mg via INTRAVENOUS
  Filled 2015-04-28 (×3): qty 1

## 2015-04-28 MED ORDER — MENTHOL 3 MG MT LOZG: 1.0000 | LOZENGE | OROMUCOSAL | Status: DC | PRN

## 2015-04-28 MED ORDER — VASOPRESSIN 20 UNIT/ML IV SOLN
INTRAVENOUS | Status: DC | PRN
  Administered 2015-04-28: 7 mL via INTRAMUSCULAR
  Administered 2015-04-28 (×2): 4 mL via INTRAMUSCULAR
  Administered 2015-04-28: 6 mL via INTRAMUSCULAR
  Administered 2015-04-28: 7 mL via INTRAMUSCULAR

## 2015-04-28 MED ORDER — METOCLOPRAMIDE HCL 5 MG/ML IJ SOLN: 10.0000 mg | Freq: Once | INTRAMUSCULAR | Status: DC | PRN

## 2015-04-28 MED ORDER — IBUPROFEN 600 MG PO TABS
600.0000 mg | ORAL_TABLET | Freq: Four times a day (QID) | ORAL | Status: DC | PRN
  Administered 2015-04-30: 600 mg via ORAL
  Filled 2015-04-28: qty 1

## 2015-04-28 MED ORDER — BUPIVACAINE-EPINEPHRINE 0.5% -1:200000 IJ SOLN
INTRAMUSCULAR | Status: DC | PRN
  Administered 2015-04-28: 10 mL

## 2015-04-28 MED ORDER — MIDAZOLAM HCL 2 MG/2ML IJ SOLN
INTRAMUSCULAR | Status: AC
  Filled 2015-04-28: qty 2

## 2015-04-28 MED ORDER — PROPOFOL 10 MG/ML IV BOLUS
INTRAVENOUS | Status: DC | PRN
  Administered 2015-04-28: 180 mg via INTRAVENOUS

## 2015-04-28 MED ORDER — FENTANYL CITRATE (PF) 100 MCG/2ML IJ SOLN
INTRAMUSCULAR | Status: DC | PRN
  Administered 2015-04-28: 100 ug via INTRAVENOUS
  Administered 2015-04-28 (×2): 50 ug via INTRAVENOUS
  Administered 2015-04-28: 100 ug via INTRAVENOUS
  Administered 2015-04-28: 50 ug via INTRAVENOUS
  Administered 2015-04-28: 100 ug via INTRAVENOUS
  Administered 2015-04-28: 50 ug via INTRAVENOUS

## 2015-04-28 MED ORDER — DEXAMETHASONE SODIUM PHOSPHATE 10 MG/ML IJ SOLN
INTRAMUSCULAR | Status: DC | PRN
  Administered 2015-04-28: 4 mg via INTRAVENOUS

## 2015-04-28 MED ORDER — MEPERIDINE HCL 25 MG/ML IJ SOLN: 6.2500 mg | INTRAMUSCULAR | Status: DC | PRN

## 2015-04-28 MED ORDER — ROCURONIUM BROMIDE 100 MG/10ML IV SOLN
INTRAVENOUS | Status: DC | PRN
  Administered 2015-04-28: 10 mg via INTRAVENOUS
  Administered 2015-04-28: 50 mg via INTRAVENOUS

## 2015-04-28 SURGICAL SUPPLY — 49 items
BARRIER ADHS 3X4 INTERCEED (GAUZE/BANDAGES/DRESSINGS) ×1 IMPLANT
BRR ADH 4X3 ABS CNTRL BYND (GAUZE/BANDAGES/DRESSINGS) ×1
CANISTER SUCT 3000ML (MISCELLANEOUS) ×2 IMPLANT
CHLORAPREP W/TINT 26ML (MISCELLANEOUS) ×2 IMPLANT
CLOTH BEACON ORANGE TIMEOUT ST (SAFETY) ×2 IMPLANT
CONT PATH 16OZ SNAP LID 3702 (MISCELLANEOUS) ×2 IMPLANT
DECANTER SPIKE VIAL GLASS SM (MISCELLANEOUS) ×2 IMPLANT
DRAIN JACKSON PRT FLT 7MM (DRAIN) IMPLANT
DRAPE CESAREAN BIRTH W POUCH (DRAPES) ×2 IMPLANT
DRSG OPSITE POSTOP 4X10 (GAUZE/BANDAGES/DRESSINGS) ×1 IMPLANT
ELECT CAUTERY BLADE 6.4 (BLADE) ×1 IMPLANT
EVACUATOR SILICONE 100CC (DRAIN) IMPLANT
GAUZE SPONGE 4X4 16PLY XRAY LF (GAUZE/BANDAGES/DRESSINGS) ×4 IMPLANT
GLOVE BIOGEL PI IND STRL 7.0 (GLOVE) ×1 IMPLANT
GLOVE BIOGEL PI IND STRL 8.5 (GLOVE) ×1 IMPLANT
GLOVE BIOGEL PI INDICATOR 7.0 (GLOVE) ×1
GLOVE BIOGEL PI INDICATOR 8.5 (GLOVE) ×1
GLOVE ECLIPSE 8.0 STRL XLNG CF (GLOVE) ×4 IMPLANT
GOWN STRL REUS W/TWL 2XL LVL3 (GOWN DISPOSABLE) ×2 IMPLANT
GOWN STRL REUS W/TWL LRG LVL3 (GOWN DISPOSABLE) ×4 IMPLANT
NEEDLE HYPO 22GX1.5 SAFETY (NEEDLE) ×2 IMPLANT
NS IRRIG 1000ML POUR BTL (IV SOLUTION) ×2 IMPLANT
PACK ABDOMINAL GYN (CUSTOM PROCEDURE TRAY) ×2 IMPLANT
PAD OB MATERNITY 4.3X12.25 (PERSONAL CARE ITEMS) ×2 IMPLANT
PENCIL SMOKE EVAC W/HOLSTER (ELECTROSURGICAL) ×2 IMPLANT
RETRACTOR WND ALEXIS 25 LRG (MISCELLANEOUS) IMPLANT
RINGERS IRRIG 1000ML POUR BTL (IV SOLUTION) ×4 IMPLANT
RTRCTR WOUND ALEXIS 25CM LRG (MISCELLANEOUS) ×2
SPONGE LAP 18X18 X RAY DECT (DISPOSABLE) ×1 IMPLANT
STAPLER VISISTAT 35W (STAPLE) ×2 IMPLANT
SUT MNCRL AB 3-0 PS2 27 (SUTURE) ×1 IMPLANT
SUT PDS AB 0 CT 36 (SUTURE) IMPLANT
SUT VIC AB 0 CT1 18XCR BRD8 (SUTURE) ×3 IMPLANT
SUT VIC AB 0 CT1 27 (SUTURE) ×4
SUT VIC AB 0 CT1 27XBRD ANBCTR (SUTURE) ×2 IMPLANT
SUT VIC AB 0 CT1 8-18 (SUTURE) ×6
SUT VIC AB 2-0 CT1 27 (SUTURE)
SUT VIC AB 2-0 CT1 TAPERPNT 27 (SUTURE) IMPLANT
SUT VIC AB 3-0 CT1 27 (SUTURE) ×2
SUT VIC AB 3-0 CT1 TAPERPNT 27 (SUTURE) ×1 IMPLANT
SUT VIC AB 3-0 SH 27 (SUTURE) ×10
SUT VIC AB 3-0 SH 27X BRD (SUTURE) ×5 IMPLANT
SUT VIC AB 4-0 SH 27 (SUTURE)
SUT VIC AB 4-0 SH 27XANBCTRL (SUTURE) IMPLANT
SUT VICRYL 0 TIES 12 18 (SUTURE) ×2 IMPLANT
SYR CONTROL 10ML LL (SYRINGE) ×2 IMPLANT
TOWEL OR 17X24 6PK STRL BLUE (TOWEL DISPOSABLE) ×4 IMPLANT
TRAY FOLEY CATH SILVER 14FR (SET/KITS/TRAYS/PACK) ×2 IMPLANT
WATER STERILE IRR 1000ML POUR (IV SOLUTION) ×2 IMPLANT

## 2015-04-28 NOTE — H&P (Signed)
The patient was interviewed and examined today.  The previously documented history and physical examination was reviewed. There are no changes. The operative procedure was reviewed. The risks and benefits were outlined again. The specific risks include, but are not limited to, anesthetic complications, bleeding, infections, and possible damage to the surrounding organs. The patient's questions were answered.  We are ready to proceed as outlined. The likelihood of the patient achieving the goals of this procedure is very likely.   BP 135/94 mmHg  Pulse 82  Temp(Src) 98 F (36.7 C) (Oral)  Resp 18  SpO2 100%  CBC    Component Value Date/Time   WBC 4.7 04/21/2015 1155   RBC 4.27 04/21/2015 1155   HGB 10.2* 04/21/2015 1155   HCT 33.4* 04/21/2015 1155   PLT 297 04/21/2015 1155   MCV 78.2 04/21/2015 1155   MCH 23.9* 04/21/2015 1155   MCHC 30.5 04/21/2015 1155   RDW 17.4* 04/21/2015 1155   LYMPHSABS 1.9 06/29/2014 1005   MONOABS 0.5 06/29/2014 1005   EOSABS 0.1 06/29/2014 1005   BASOSABS 0.0 06/29/2014 1005   Gildardo Cranker, M.D.

## 2015-04-28 NOTE — H&P (Signed)
Admission History and Physical Exam for a Gynecology Patient  Cassie Ward is a 44 y.o. female who presents for an abdominal myomectomy. The patient complains of menorrhagia, dysmenorrhea, and anemia.  She has had a prior abdominal myomectomy.  In 2014 she had hysteroscopy with resection of a submucosal fibroid.  The patient has been seen on multiple medical visits.  She has been seen in the emergency room.  Her hemoglobin was 9.7.  She has required nonsteroidal pain medication and narcotic pain medication.  She wants to maintain her childbearing potential.  She has a history of infertility.  She has seen a reproductive endocrinologist.  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  She was told to consider donor egg in vitro fertilization. An ultrasound showed a multi-fibroid uterus containing at least 6 fibroids with the largest fibroid measuring 5 cm.  A submucosal fibroid was noted. Her most recent Pap smear is within normal limits.  Gonorrhea and Chlamydia cultures are negative.  She was treated recently with metronidazole for vaginosis. She has been followed at the [facility].  OB History    Gravida Para Term Preterm AB TAB SAB Ectopic Multiple Living   2 1   1            Past Medical History  Diagnosis Date  . Fibroids 2010  . Hypertension   . Dysrhythmia     irregular heart beats  . Anxiety   . Headache     Migraine  . Anemia     No prescriptions prior to admission    Past Surgical History  Procedure Laterality Date  . Tonsilectomy/adenoidectomy with myringotomy    . Myomectomy    . Vaginal deliveries  1989  . Dilatation & currettage/hysteroscopy with resectocope N/A 08/23/2012    Procedure: [place];  Surgeon: Cassie Dawley, MD;  Location: [facility];  Service: Gynecology;  Laterality: N/A;  Hysteroscopy with Resection of Submucosal Fibroid; D&C - 60  minutes    No Known Allergies  Family History: family history includes Cancer in her mother.  Social History:  reports that she has never smoked. She has never used smokeless tobacco. She reports that she drinks alcohol. She reports that she does not use illicit drugs.  Review of systems: See HPI.  Admission Physical Exam:    BMI is 30.7.  HEENT:                 Within normal limits Chest:                   Clear Heart:                    Regular rate and rhythm Breasts:                No masses, skin changes, bleeding, or discharge present Abdomen:             Nontender, no masses Extremities:          Grossly normal Neurologic exam: Grossly normal  Pelvic exam:  External genitalia: normal general appearance Vaginal: normal without tenderness, induration or masses Cervix: normal appearance Adnexa: normal bimanual exam Uterus: irregular, 12-14 week size, tender. Rectal: good sphincter tone  Assessment:  Menorrhagia  Dysmenorrhea  Fibroid uterus  Anemia  Overweight  Hypertension  Anxiety  Prior myomectomy  Plan:  The patient will be admitted for an abdominal  myomectomy.  She understands the indication for her surgical procedure.  She accepts the risks of, but not limited to, anesthetic applications, bleeding, infections, and possible damage to the surrounding organs.   Cassie Ward 04/28/2015

## 2015-04-28 NOTE — Anesthesia Postprocedure Evaluation (Signed)
Anesthesia Post Note  Patient: Cassie Ward  Procedure(s) Performed: Procedure(s) (LRB): ABDOMINAL MYOMECTOMY (N/A)  Patient location during evaluation: PACU Anesthesia Type: General Level of consciousness: awake and alert Pain management: pain level controlled Vital Signs Assessment: post-procedure vital signs reviewed and stable Respiratory status: spontaneous breathing, nonlabored ventilation, respiratory function stable and patient connected to nasal cannula oxygen Cardiovascular status: blood pressure returned to baseline and stable Postop Assessment: no signs of nausea or vomiting Anesthetic complications: no    Last Vitals:  Filed Vitals:   04/28/15 1915 04/28/15 1930  BP: 110/69 100/62  Pulse: 83 79  Temp:    Resp: 14 14    Last Pain:  Filed Vitals:   04/28/15 1939  PainSc: 5                  Alli Jasmer A.

## 2015-04-28 NOTE — Anesthesia Procedure Notes (Signed)
Procedure Name: Intubation Date/Time: 04/28/2015 3:35 PM Performed by: Ignacia Bayley Pre-anesthesia Checklist: Patient identified, Emergency Drugs available, Suction available and Patient being monitored Patient Re-evaluated:Patient Re-evaluated prior to inductionOxygen Delivery Method: Circle system utilized Preoxygenation: Pre-oxygenation with 100% oxygen Intubation Type: IV induction Ventilation: Mask ventilation without difficulty Laryngoscope Size: Miller and 2 Grade View: Grade I Tube type: Oral Tube size: 7.0 mm Number of attempts: 1 Airway Equipment and Method: Stylet Placement Confirmation: ETT inserted through vocal cords under direct vision,  positive ETCO2 and breath sounds checked- equal and bilateral Secured at: 20 cm Tube secured with: Tape Dental Injury: Teeth and Oropharynx as per pre-operative assessment

## 2015-04-28 NOTE — Op Note (Signed)
OPERATIVE NOTE  Cassie Ward  DOB:    [birth year]  MRN:    [id number]  CSN:    [id number]  Date of Surgery:  04/28/2015  Preoperative Diagnosis:  Menorrhagia  Dysmenorrhea  Fibroid uterus  Anemia  Obesity  Prior myomectomy  Hypertension  Postoperative Diagnosis:  Same  Procedure:  Multiple abdominal myomectomy  Surgeon:  Gildardo Cranker, M.D.  Assistant:  Earnstine Regal, PA-C  Anesthetic:  General  Disposition:  The patient presents with the above diagnosis. She understands the indications for surgical procedure as well as her alternative treatment options. She accepts the risk of, but not limited to, anesthetic complications, bleeding, infections, and possible damage to the surrounding organs.  Findings:  The patient had a 14 week size multi-fibroid uterus. A total of 22 fibroids were removed with the largest fibroid measuring approximately 3 cm in diameter. At least 4 other fibroids were submucosal. The right fallopian tube appeared delicate. The right ovary had filmy adhesions between the ovary and the right posterior uterus. The left fallopian tube was adhered to the left ovary. There were filmy adhesions in the anterior cul-de-sac between the anterior uterus and the anterior peritoneum. No other abdominal pathology could be appreciated. Incisions were required on the anterior uterus and the posterior uterus to remove all of the fibroids. Deep penetration of the myometrium was required to remove all of the fibroids. A cesarean section is recommended for future deliveries.  Procedure:  The patient was taken to the operating room where a general anesthetic was given. A timeout was performed confirming the appropriate procedure. Foley catheter was placed in the bladder. Her perineum and vagina were prepped with Betadine. The patient's abdomen was prepped with DuraPrep.  A  The patient was sterilely draped. The patient's lower abdomen was injected with 10 cc  of half percent Marcaine with epinephrine. A low transverse incision was made in the lower abdomen and carried sharply through the subcutaneous tissue, the fascia, and the anterior peritoneum. The pelvis was explored with findings as mentioned above. The uterus was elevated into the operative field.  The adhesions in the anterior cul-de-sac were lysed using sharp dissection. The anterior surface of the uterus was injected with a diluted solution of Pitressin and saline just above the largest fibroid. An incision was made in the myometrium and carried sharply to the level of the fibroid. The fibroid was removed from the myometrium using a combination of sharp and blunt dissection. Multiple fibroids were removed through this same incision. The endometrial space was entered and the submucosal fibroids were also removed. The defect in the uterus was closed using interrupted sutures of 0 Vicryl in the myometrium, and then a baseball suture of 3-0 Vicryl on the surface of the uterus. Care was taken not to damage the fallopian tubes. The posterior surface of the uterus was injected. An incision was made to facilitate removal of multiple fibroids from the posterior surface. The defect was then closed as mentioned above. The pelvis was irrigated. Hemostasis was confirmed. Interceed was placed over the uterine incisions. All instruments and packs were removed from the abdominal cavity. The anterior peritoneum and the abdominal musculature were reapproximated in the midline using 3-0 Vicryl. The abdominal musculature and the fascia were irrigated. Hemostasis was adequate. The fascia was closed using a running suture of 0 Vicryl followed by several interrupted sutures. The subcutaneous layer was closed using interrupted sutures. The skin was reapproximated using a subcuticular suture of 3-0 Monocryl. 0 Vicryl is  the suture material used except where otherwise mentioned.  Sponge, needle, and instrument counts were correct on 2  occasions. The estimated blood loss for the procedure was 300 cc. The patient tolerated her procedure well. She was awakened from her anesthetic without difficulty. She was transported to the recovery room in stable condition. She was noted to drain clear yellow urine. Pathology specimens included: 22 fibroids.   Gildardo Cranker, M.D.  04/28/2015

## 2015-04-28 NOTE — Transfer of Care (Signed)
Immediate Anesthesia Transfer of Care Note  Patient: Cassie Ward  Procedure(s) Performed: Procedure(s): ABDOMINAL MYOMECTOMY (N/A)  Patient Location: PACU  Anesthesia Type:General  Level of Consciousness: awake, alert  and oriented  Airway & Oxygen Therapy: Patient Spontanous Breathing and Patient connected to nasal cannula oxygen  Post-op Assessment: Report given to RN and Post -op Vital signs reviewed and stable  Post vital signs: Reviewed and stable  Last Vitals:  Filed Vitals:   04/28/15 1244  BP: 135/94  Pulse: 82  Temp: 36.7 C  Resp: 18    Complications: No apparent anesthesia complications

## 2015-04-28 NOTE — Anesthesia Preprocedure Evaluation (Signed)
Anesthesia Evaluation  Patient identified by MRN, date of birth, ID band Patient awake    Reviewed: Allergy & Precautions, NPO status , Patient's Chart, lab work & pertinent test results  Airway Mallampati: II       Dental no notable dental hx. (+) Teeth Intact   Pulmonary neg pulmonary ROS,    Pulmonary exam normal breath sounds clear to auscultation       Cardiovascular hypertension, Pt. on medications Normal cardiovascular exam+ dysrhythmias  Rhythm:Regular Rate:Normal     Neuro/Psych  Headaches, negative psych ROS   GI/Hepatic negative GI ROS, Neg liver ROS,   Endo/Other  Obesity  Renal/GU negative Renal ROS  negative genitourinary   Musculoskeletal negative musculoskeletal ROS (+)   Abdominal   Peds  Hematology  (+) anemia ,   Anesthesia Other Findings   Reproductive/Obstetrics Uterine leiomyomas Menorrhagia Pelvic pain                             Anesthesia Physical Anesthesia Plan  ASA: II  Anesthesia Plan: General   Post-op Pain Management:    Induction: Intravenous  Airway Management Planned: Oral ETT  Additional Equipment:   Intra-op Plan:   Post-operative Plan: Extubation in OR  Informed Consent: I have reviewed the patients History and Physical, chart, labs and discussed the procedure including the risks, benefits and alternatives for the proposed anesthesia with the patient or authorized representative who has indicated his/her understanding and acceptance.   Dental advisory given  Plan Discussed with: CRNA, Anesthesiologist and Surgeon  Anesthesia Plan Comments:         Anesthesia Quick Evaluation

## 2015-04-29 ENCOUNTER — Encounter (HOSPITAL_COMMUNITY): Payer: Self-pay | Admitting: Obstetrics and Gynecology

## 2015-04-29 LAB — CBC
HCT: 29.3 % — ABNORMAL LOW (ref 36.0–46.0)
Hemoglobin: 9.1 g/dL — ABNORMAL LOW (ref 12.0–15.0)
MCH: 24.1 pg — ABNORMAL LOW (ref 26.0–34.0)
MCHC: 31.1 g/dL (ref 30.0–36.0)
MCV: 77.7 fL — ABNORMAL LOW (ref 78.0–100.0)
Platelets: 318 10*3/uL (ref 150–400)
RBC: 3.77 MIL/uL — ABNORMAL LOW (ref 3.87–5.11)
RDW: 17 % — ABNORMAL HIGH (ref 11.5–15.5)
WBC: 9.5 10*3/uL (ref 4.0–10.5)

## 2015-04-29 NOTE — Progress Notes (Signed)
Cassie Ward is a57 y.o.  [id number]  Post Op Date # 1: Abdominal Myomectomy  Subjective: Patient is Doing well postoperatively. Patient has Pain is controlled with current analgesics. Medications being used: prescription NSAID's including Toradol and narcotic analgesics including Hydromorphone. Reports dizziness with ambulation and feeling less lightheaded with sitting up.  Tolerating liquids and has not passed flatus.  Foley just removed so has not yet voided.   Objective: Vital signs in last 24 hours: Temp:  [97.6 F (36.4 C)-98.4 F (36.9 C)] 98.3 F (36.8 C) (12/21 0641) Pulse Rate:  [61-85] 64 (12/21 0641) Resp:  [11-20] 17 (12/21 0641) BP: (94-135)/(53-94) 112/76 mmHg (12/21 0641) SpO2:  [92 %-100 %] 98 % (12/21 0641)  Intake/Output from previous day: 12/20 0701 - 12/21 0700 In: 5074.6 [P.O.:720; I.V.:4354.6] Out: 2050 [Urine:1750] Intake/Output this shift: Total I/O In: -  Out: 100 [Urine:100] No results for input(s): WBC, HGB, HCT, PLT in the last 168 hours.  No results for input(s): NA, K, CL, CO2, BUN, CREATININE, CALCIUM, PROT, BILITOT, ALKPHOS, ALT, AST, GLUCOSE in the last 168 hours.  Invalid input(s): LABALBU  EXAM: General: alert, cooperative, fatigued and no distress Resp: clear to auscultation bilaterally Cardio: regular rate and rhythm, S1, S2 normal, no murmur, click, rub or gallop GI: Bowel sounds present;  dressing clean/dry/intact. Extremities: Homans sign is negative, no sign of DVT and no calf tenderness.   Assessment: s/p Procedure(s): ABDOMINAL MYOMECTOMY: stable and progressing well  Plan: Advance diet Encourage ambulation Advance to PO medication CBC pending  Orthostatic Vital Signs Routine care  LOS: 1 day    Crayton Savarese, PA-C 04/29/2015 7:07 AM

## 2015-04-29 NOTE — Progress Notes (Signed)
PROGRESS NOTE  I have reviewed the patient's vital signs, labs, and notes. I have examined the patient. I agree with the previous note from the physician assistant.  CBC    Component Value Date/Time   WBC 9.5 04/29/2015 0700   RBC 3.77* 04/29/2015 0700   HGB 9.1* 04/29/2015 0700   HCT 29.3* 04/29/2015 0700   PLT 318 04/29/2015 0700   MCV 77.7* 04/29/2015 0700   MCH 24.1* 04/29/2015 0700   MCHC 31.1 04/29/2015 0700   RDW 17.0* 04/29/2015 0700   LYMPHSABS 1.9 06/29/2014 1005   MONOABS 0.5 06/29/2014 1005   EOSABS 0.1 06/29/2014 1005   BASOSABS 0.0 06/29/2014 1005   Transfusion discussed. R and B reviewed. Questions answered. Declined for now.  Gildardo Cranker, M.D. 04/29/2015

## 2015-04-29 NOTE — Addendum Note (Signed)
Addendum  created 04/29/15 [id number] by Raenette Rover, CRNA   Modules edited: Clinical Notes   Clinical Notes:  File: [id number]

## 2015-04-29 NOTE — Anesthesia Postprocedure Evaluation (Signed)
Anesthesia Post Note  Patient: Cassie Ward  Procedure(s) Performed: Procedure(s) (LRB): ABDOMINAL MYOMECTOMY (N/A)  Patient location during evaluation: [facility] Anesthesia Type: General Level of consciousness: awake, awake and alert, oriented and patient cooperative Pain management: pain level controlled Vital Signs Assessment: post-procedure vital signs reviewed and stable Respiratory status: spontaneous breathing, nonlabored ventilation, respiratory function stable and patient connected to nasal cannula oxygen Cardiovascular status: stable Postop Assessment: no headache, no backache, patient able to bend at knees and no signs of nausea or vomiting Anesthetic complications: no    Last Vitals:  Filed Vitals:   04/29/15 0633 04/29/15 0641  BP:  112/76  Pulse:  64  Temp:  36.8 C  Resp: 16 17    Last Pain:  Filed Vitals:   04/29/15 0642  PainSc: 2                  Milicent Acheampong L

## 2015-04-30 MED ORDER — OXYCODONE-ACETAMINOPHEN 5-325 MG PO TABS: 1.0000 | ORAL_TABLET | Freq: Four times a day (QID) | ORAL | Status: DC | PRN

## 2015-04-30 MED ORDER — IBUPROFEN 600 MG PO TABS: ORAL_TABLET | ORAL | Status: DC

## 2015-04-30 MED ORDER — ONDANSETRON HCL 4 MG PO TABS: 4.0000 mg | ORAL_TABLET | Freq: Three times a day (TID) | ORAL | Status: DC | PRN

## 2015-04-30 NOTE — Discharge Summary (Signed)
Physician Discharge Summary  Patient ID: SNEHA HASHIMOTO MRN: [id number] DOB/AGE: 01/03/1971 44 y.o.  Admit date: 04/28/2015 Discharge date: 04/30/2015   Discharge Diagnoses: Uterine Fibroids and Anemia Active Problems:   Fibroid, uterine   Operation: Abdominal Myomectomy   Discharged Condition:  Good   Hospital Course: On the date of admission the patient underwent the aforementioned procedure and tolerated it well.  She had approximately 22 uterine fibroids removed and post operatively tolerated a hemoglobin of 9.1  (pre-operative hemoglobin was 10.2.  By post operative day #2 the patient had resumed bowel and bladder function and was deemed ready for discharge home.     Disposition: Home to Self Care  Discharge Medications:    Medication List    STOP taking these medications        diclofenac 75 MG EC tablet  Commonly known as:  VOLTAREN     HYDROcodone-acetaminophen 5-325 MG tablet  Commonly known as:  NORCO/VICODIN     naproxen sodium 220 MG tablet  Commonly known as:  ANAPROX      TAKE these medications        ibuprofen 600 MG tablet  Commonly known as:  ADVIL,MOTRIN  take 1 po  pc every 6 hours for 5 days then as needed for pain     lisinopril-hydrochlorothiazide 10-12.5 MG tablet  Commonly known as:  PRINZIDE,ZESTORETIC  Take 1 tablet by mouth daily.     loratadine 10 MG tablet  Commonly known as:  CLARITIN  Take 1 tablet by mouth daily as needed for allergies.     ondansetron 4 MG tablet  Commonly known as:  ZOFRAN  Take 1 tablet (4 mg total) by mouth every 8 (eight) hours as needed for nausea or vomiting.     oxyCODONE-acetaminophen 5-325 MG tablet  Commonly known as:  PERCOCET/ROXICET  Take 1-2 tablets by mouth every 6 (six) hours as needed for severe pain (moderate to severe pain (when tolerating fluids)).          Follow-up: Dr. [name] on June 09, 2015 ay 10:45 a.m.   SignedEarnstine Regal, PA-C 04/30/2015, 8:17 AM

## 2015-04-30 NOTE — Progress Notes (Signed)
PROGRESS NOTE  I have reviewed the patient's vital signs, labs, and notes. I have examined the patient. I agree with the previous note from the physician assistant.  Gildardo Cranker, M.D. 04/30/2015

## 2015-04-30 NOTE — Progress Notes (Signed)
Cassie Ward is a45 y.o.  [id number]  Post Op Date # 2:  Abdominal Myomectomy  Subjective: Patient is Doing well postoperatively. Patient has Pain is controlled with current analgesics. Medications being used: prescription NSAID's including Ibuprofen 600 mg and narcotic analgesics including Percocet 5/325.Marland Kitchen Ambulating with minimal lightheadedness, tolerating regular diet and voiding without difficulty.  Objective: Vital signs in last 24 hours: Temp:  [98.1 F (36.7 C)-98.8 F (37.1 C)] 98.1 F (36.7 C) (12/22 0531) Pulse Rate:  [64-88] 80 (12/22 0531) Resp:  [18] 18 (12/22 0531) BP: (103-144)/(51-89) 115/75 mmHg (12/22 0531) SpO2:  [96 %-100 %] 99 % (12/22 0531) Weight:  [178 lb (80.74 kg)] 178 lb (80.74 kg) (12/21 1700)  Intake/Output from previous day: 12/21 0701 - 12/22 0700 In: 1658.3 [I.V.:1658.3] Out: 402 [Urine:402] Intake/Output this shift:    Recent Labs Lab 04/29/15 0700  WBC 9.5  HGB 9.1*  HCT 29.3*  PLT 318    No results for input(s): NA, K, CL, CO2, BUN, CREATININE, CALCIUM, PROT, BILITOT, ALKPHOS, ALT, AST, GLUCOSE in the last 168 hours.  Invalid input(s): LABALBU  EXAM: General: alert, cooperative and no distress Resp: clear to auscultation bilaterally Cardio: regular rate and rhythm, S1, S2 normal, no murmur, click, rub or gallop GI: Bowel sounds present and dressing is clean/dry/intact Extremities: Homans sign is negative, no sign of DVT and no calf tenderness.   Assessment: s/p Procedure(s): ABDOMINAL MYOMECTOMY: stable, progressing well and anemia  Plan: Discharge home  LOS: 2 days    Jenalyn Girdner, PA-C 04/30/2015 8:06 AM

## 2015-04-30 NOTE — Discharge Instructions (Signed)
Call [facility] @ [phone] if:  You have a temperature greater than or equal to 100.4 degrees Farenheit orally You have pain that is not made better by the pain medication given and taken as directed You have excessive bleeding or problems urinating  Take Colace (Docusate Sodium/Stool Softener) 100 mg 2-3 times daily while taking narcotic pain medicine to avoid constipation or until bowel movements are regular. Take iron supplements twice a day for the next 12 weeks  You may drive after 2 weeks You may walk up steps  You may shower  You may resume a regular diet  Keep incisions clean and dry and remove honeycomb dressing on May 05, 2015 Do not lift over 15 pounds for 6 weeks Avoid anything in vagina for 6 weeks (or until after your post-operative visit)

## 2015-04-30 NOTE — Progress Notes (Signed)
Patient discharged home with husband... Discharge instructions reviewed with patient and she verbalized understanding... Condition stable... No equipment... Taken to car in wheelchair by A. Lysbeth Galas, [place].

## 2015-06-04 ENCOUNTER — Ambulatory Visit
Admission: RE | Admit: 2015-06-04 | Discharge: 2015-06-04 | Disposition: A | Discharge status: Home / Self Care | Payer: Managed Care, Other (non HMO) | Source: Ambulatory Visit | Attending: Obstetrics and Gynecology | Admitting: Obstetrics and Gynecology

## 2015-06-04 DIAGNOSIS — N644 Mastodynia: Secondary | ICD-10-CM

## 2016-05-09 DIAGNOSIS — S060XAA Concussion with loss of consciousness status unknown, initial encounter: Secondary | ICD-10-CM

## 2016-05-09 DIAGNOSIS — S060X9A Concussion with loss of consciousness of unspecified duration, initial encounter: Secondary | ICD-10-CM

## 2016-05-09 HISTORY — DX: Concussion with loss of consciousness of unspecified duration, initial encounter: S06.0X9A

## 2016-05-09 HISTORY — DX: Concussion with loss of consciousness status unknown, initial encounter: S06.0XAA

## 2016-12-08 NOTE — Telephone Encounter (Signed)
Looking up telephone number.   AVS 

## 2017-07-07 DIAGNOSIS — U071 COVID-19: Secondary | ICD-10-CM

## 2017-07-07 HISTORY — DX: COVID-19: U07.1

## 2020-07-17 ENCOUNTER — Other Ambulatory Visit: Payer: Self-pay | Admitting: Obstetrics & Gynecology

## 2020-07-17 DIAGNOSIS — R922 Inconclusive mammogram: Secondary | ICD-10-CM

## 2020-08-03 ENCOUNTER — Emergency Department (HOSPITAL_COMMUNITY)
Admission: AD | Admit: 2020-08-03 | Discharge: 2020-08-03 | Disposition: A | Discharge status: Home / Self Care | Payer: BC Managed Care – PPO | Attending: Emergency Medicine | Admitting: Emergency Medicine

## 2020-08-03 ENCOUNTER — Encounter (HOSPITAL_BASED_OUTPATIENT_CLINIC_OR_DEPARTMENT_OTHER): Payer: Self-pay | Admitting: Emergency Medicine

## 2020-08-03 ENCOUNTER — Emergency Department (HOSPITAL_COMMUNITY): Payer: BC Managed Care – PPO

## 2020-08-03 ENCOUNTER — Other Ambulatory Visit: Payer: Self-pay

## 2020-08-03 ENCOUNTER — Emergency Department (HOSPITAL_BASED_OUTPATIENT_CLINIC_OR_DEPARTMENT_OTHER): Payer: BC Managed Care – PPO

## 2020-08-03 ENCOUNTER — Emergency Department (HOSPITAL_BASED_OUTPATIENT_CLINIC_OR_DEPARTMENT_OTHER)
Admission: EM | Admit: 2020-08-03 | Discharge: 2020-08-03 | Disposition: A | Discharge status: Home / Self Care | Payer: BC Managed Care – PPO | Source: Home / Self Care | Attending: Emergency Medicine | Admitting: Emergency Medicine

## 2020-08-03 DIAGNOSIS — Z79899 Other long term (current) drug therapy: Secondary | ICD-10-CM | POA: Insufficient documentation

## 2020-08-03 DIAGNOSIS — N939 Abnormal uterine and vaginal bleeding, unspecified: Secondary | ICD-10-CM | POA: Insufficient documentation

## 2020-08-03 DIAGNOSIS — R102 Pelvic and perineal pain: Secondary | ICD-10-CM

## 2020-08-03 DIAGNOSIS — N8500 Endometrial hyperplasia, unspecified: Secondary | ICD-10-CM | POA: Insufficient documentation

## 2020-08-03 DIAGNOSIS — R9389 Abnormal findings on diagnostic imaging of other specified body structures: Secondary | ICD-10-CM

## 2020-08-03 DIAGNOSIS — Z5321 Procedure and treatment not carried out due to patient leaving prior to being seen by health care provider: Secondary | ICD-10-CM | POA: Insufficient documentation

## 2020-08-03 DIAGNOSIS — I10 Essential (primary) hypertension: Secondary | ICD-10-CM | POA: Insufficient documentation

## 2020-08-03 DIAGNOSIS — N83292 Other ovarian cyst, left side: Secondary | ICD-10-CM | POA: Insufficient documentation

## 2020-08-03 DIAGNOSIS — N949 Unspecified condition associated with female genital organs and menstrual cycle: Secondary | ICD-10-CM

## 2020-08-03 LAB — CBC WITH DIFFERENTIAL/PLATELET
Abs Immature Granulocytes: 0.01 10*3/uL (ref 0.00–0.07)
Abs Immature Granulocytes: 0.01 10*3/uL (ref 0.00–0.07)
Basophils Absolute: 0 10*3/uL (ref 0.0–0.1)
Basophils Absolute: 0 10*3/uL (ref 0.0–0.1)
Basophils Relative: 0 %
Basophils Relative: 1 %
Eosinophils Absolute: 0.3 10*3/uL (ref 0.0–0.5)
Eosinophils Absolute: 0.3 10*3/uL (ref 0.0–0.5)
Eosinophils Relative: 5 %
Eosinophils Relative: 4 %
HCT: 37 % (ref 36.0–46.0)
HCT: 34.6 % — ABNORMAL LOW (ref 36.0–46.0)
Hemoglobin: 11.7 g/dL — ABNORMAL LOW (ref 12.0–15.0)
Hemoglobin: 10.9 g/dL — ABNORMAL LOW (ref 12.0–15.0)
Immature Granulocytes: 0 %
Immature Granulocytes: 0 %
Lymphocytes Relative: 37 %
Lymphocytes Relative: 34 %
Lymphs Abs: 1.9 10*3/uL (ref 0.7–4.0)
Lymphs Abs: 2.1 10*3/uL (ref 0.7–4.0)
MCH: 25.3 pg — ABNORMAL LOW (ref 26.0–34.0)
MCH: 24.9 pg — ABNORMAL LOW (ref 26.0–34.0)
MCHC: 31.6 g/dL (ref 30.0–36.0)
MCHC: 31.5 g/dL (ref 30.0–36.0)
MCV: 80.1 fL (ref 80.0–100.0)
MCV: 79 fL — ABNORMAL LOW (ref 80.0–100.0)
Monocytes Absolute: 0.4 10*3/uL (ref 0.1–1.0)
Monocytes Absolute: 0.4 10*3/uL (ref 0.1–1.0)
Monocytes Relative: 8 %
Monocytes Relative: 7 %
Neutro Abs: 2.5 10*3/uL (ref 1.7–7.7)
Neutro Abs: 3.5 10*3/uL (ref 1.7–7.7)
Neutrophils Relative %: 50 %
Neutrophils Relative %: 54 %
Platelets: 345 10*3/uL (ref 150–400)
Platelets: 316 10*3/uL (ref 150–400)
RBC: 4.62 MIL/uL (ref 3.87–5.11)
RBC: 4.38 MIL/uL (ref 3.87–5.11)
RDW: 17.9 % — ABNORMAL HIGH (ref 11.5–15.5)
RDW: 17.5 % — ABNORMAL HIGH (ref 11.5–15.5)
WBC: 5 10*3/uL (ref 4.0–10.5)
WBC: 6.4 10*3/uL (ref 4.0–10.5)
nRBC: 0 % (ref 0.0–0.2)
nRBC: 0 % (ref 0.0–0.2)

## 2020-08-03 LAB — COMPREHENSIVE METABOLIC PANEL
ALT: 12 U/L (ref 0–44)
AST: 19 U/L (ref 15–41)
Albumin: 4.1 g/dL (ref 3.5–5.0)
Alkaline Phosphatase: 55 U/L (ref 38–126)
Anion gap: 8 (ref 5–15)
BUN: 7 mg/dL (ref 6–20)
CO2: 26 mmol/L (ref 22–32)
Calcium: 9.3 mg/dL (ref 8.9–10.3)
Chloride: 104 mmol/L (ref 98–111)
Creatinine, Ser: 0.77 mg/dL (ref 0.44–1.00)
GFR, Estimated: >60 mL/min (ref >60)
Glucose, Bld: 84 mg/dL (ref 70–99)
Potassium: 4.1 mmol/L (ref 3.5–5.1)
Sodium: 138 mmol/L (ref 135–145)
Total Bilirubin: 0.5 mg/dL (ref 0.3–1.2)
Total Protein: 7.8 g/dL (ref 6.5–8.1)

## 2020-08-03 LAB — WET PREP, GENITAL
Clue Cells Wet Prep HPF POC: NONE SEEN
Sperm: NONE SEEN
Trich, Wet Prep: NONE SEEN
Yeast Wet Prep HPF POC: NONE SEEN

## 2020-08-03 LAB — I-STAT BETA HCG BLOOD, ED (MC, WL, AP ONLY): I-stat hCG, quantitative: <5 m[IU]/mL (ref <5)

## 2020-08-03 LAB — POCT PREGNANCY, URINE: Preg Test, Ur: NEGATIVE

## 2020-08-03 MED ORDER — MEDROXYPROGESTERONE ACETATE 10 MG PO TABS: 10.0000 mg | ORAL_TABLET | Freq: Every day | ORAL | 0 refills | Status: DC

## 2020-08-03 NOTE — MAU Provider Note (Signed)
   S [name] is a 50 y.o. G2P0010 patient who presents to MAU today with complaint of heavy vaginal bleeding for 2 days.   O BP (!) 182/93 (BP Location: Right Arm)   Pulse 69   Temp 98.4 F (36.9 C) (Oral)   Resp 16   Ht 5\' 4"  (1.626 m)   Wt 83.7 kg   LMP 08/02/2020   SpO2 99% Comment: room air  BMI 31.69 kg/m  Physical Exam Vitals reviewed.  Constitutional:      Appearance: Normal appearance.  HENT:     Head: Normocephalic and atraumatic.  Neurological:     Mental Status: She is alert.  Psychiatric:        Mood and Affect: Mood normal.        Behavior: Behavior normal.        Thought Content: Thought content normal.        Judgment: Judgment normal.     A Medical screening exam complete Menorrhagia Not pregnant.  P Discharge from MAU in stable condition Patient given the option of transfer to [facility] for further evaluation. She would like to be seen there. Called Dr Tyrone Nine, who accepted the patient.  Truett Mainland, DO 08/03/2020 2:22 PM

## 2020-08-03 NOTE — ED Provider Notes (Signed)
[facility] EMERGENCY DEPARTMENT Provider Note   CSN: [id number] Arrival date & time: 08/03/20  1643     History Chief Complaint  Patient presents with  . Vaginal Bleeding    Cassie Ward is a 50 y.o. female with PMHx HTN and fibroids who presents to the ED today with complaint of vaginal bleeding that began last night.  She reports she did not have a menstrual cycle since October.  Her OB/GYN assumed that she was going through menopause however last night she began having vaginal bleeding.  She reports that she was using regular pads as well as super tampons and had to change them approximately every 30 minutes to 1 hour.  She went to the [facility] earlier today and had a negative pregnancy test.  She was advised to go to [facility] for further evaluation.  Patient checked in however left prior to being seen.  She did have a CBC done at that time which showed a stable hemoglobin at 11.7.  Ports that while at the [facility] hospital they gave her a large super overnight pads to use.  She states she is changing them out every 2-3 hours now.  She does report history of fibroids, saw her OB/GYN recently about 2 to 3 weeks ago who did a pelvic exam and states that she was told that the fibroid could be palpated.  She did start having some pelvic pain a day before having bleeding.  She denies any abdominal pain, nausea, vomiting, vaginal discharge, dizziness, lightheadedness, chest pain, shortness of breath.   The history is provided by the patient and medical records.       Past Medical History:  Diagnosis Date  . Anemia   . Anxiety   . Dysrhythmia    irregular heart beats  . Fibroids 2010  . Headache    Migraine  . Hypertension     Patient Active Problem List   Diagnosis Date Noted  . Fibroid, uterine 04/28/2015  . Fibroids 05/16/2012  . Pelvic pain in female 05/16/2012    Past Surgical History:  Procedure Laterality Date  . DILATATION &  CURRETTAGE/HYSTEROSCOPY WITH RESECTOCOPE N/A 08/23/2012   Procedure: [place];  Surgeon: Ena Dawley, MD;  Location: [facility];  Service: Gynecology;  Laterality: N/A;  Hysteroscopy with Resection of Submucosal Fibroid; D&C - 60 minutes  . MYOMECTOMY    . MYOMECTOMY N/A 04/28/2015   Procedure: ABDOMINAL MYOMECTOMY;  Surgeon: Ena Dawley, MD;  Location: [facility];  Service: Gynecology;  Laterality: N/A;  . TONSILECTOMY/ADENOIDECTOMY WITH MYRINGOTOMY    . vaginal deliveries  1989     OB History    Gravida  2   Para  1   Term      Preterm      AB  1   Living        SAB      IAB      Ectopic      Multiple      Live Births              Family History  Problem Relation Age of Onset  . Cancer Mother        Breast    Social History   Tobacco Use  . Smoking status: Never Smoker  . Smokeless tobacco: Never Used  Substance Use Topics  . Alcohol use: Yes    Comment: occ  . Drug use: No    Home Medications Prior to  Admission medications   Medication Sig Start Date End Date Taking? Authorizing Provider  medroxyPROGESTERone (PROVERA) 10 MG tablet Take 1 tablet (10 mg total) by mouth daily for 10 days. 08/03/20 08/13/20 Yes Josey Dettmann, PA-C  ibuprofen (ADVIL,MOTRIN) 600 MG tablet take 1 po  pc every 6 hours for 5 days then as needed for pain 04/30/15   Earnstine Regal, PA-C  lisinopril-hydrochlorothiazide (PRINZIDE,ZESTORETIC) 10-12.5 MG tablet Take 1 tablet by mouth daily.    [provider]  loratadine (CLARITIN) 10 MG tablet Take 1 tablet by mouth daily as needed for allergies.  03/16/14   [provider]  ondansetron (ZOFRAN) 4 MG tablet Take 1 tablet (4 mg total) by mouth every 8 (eight) hours as needed for nausea or vomiting. 04/30/15   Earnstine Regal, PA-C  oxyCODONE-acetaminophen (PERCOCET/ROXICET) 5-325 MG tablet Take 1-2 tablets by mouth every 6 (six) hours as needed for severe pain (moderate to  severe pain (when tolerating fluids)). 04/30/15   Earnstine Regal, PA-C    Allergies    Patient has no known allergies.  Review of Systems   Review of Systems  Constitutional: Negative for chills and fever.  Gastrointestinal: Negative for abdominal pain, nausea and vomiting.  Genitourinary: Positive for pelvic pain and vaginal bleeding. Negative for vaginal discharge.  All other systems reviewed and are negative.   Physical Exam Updated Vital Signs BP (!) 156/87 (BP Location: Left Arm)   Pulse 73   Temp 98.1 F (36.7 C) (Oral)   Resp 18   Ht 5\' 4"  (1.626 m)   Wt 83.5 kg   LMP 08/02/2020   SpO2 100%   BMI 31.58 kg/m   Physical Exam Vitals and nursing note reviewed.  Constitutional:      Appearance: She is not ill-appearing or diaphoretic.  HENT:     Head: Normocephalic and atraumatic.  Eyes:     Conjunctiva/sclera: Conjunctivae normal.  Cardiovascular:     Rate and Rhythm: Normal rate and regular rhythm.     Pulses: Normal pulses.  Pulmonary:     Effort: Pulmonary effort is normal.     Breath sounds: Normal breath sounds. No wheezing, rhonchi or rales.  Abdominal:     Palpations: Abdomen is soft.     Tenderness: There is no abdominal tenderness. There is no guarding or rebound.  Genitourinary:    Comments: Chaperone present for exam. No rashes, lesions, or tenderness to external genitalia. No erythema, injury, or tenderness to vaginal mucosa. Moderate amount of blood in vault. Large adnexal mass appreciated with bimanual exam; difficult to assess location as it seems centralized. No CMT, cervical friability, or discharge from cervical os. Cervical os is closed.  Musculoskeletal:     Cervical back: Neck supple.  Skin:    General: Skin is warm and dry.  Neurological:     Mental Status: She is alert.     ED Results / Procedures / Treatments   Labs (all labs ordered are listed, but only abnormal results are displayed) Labs Reviewed  WET PREP, GENITAL - Abnormal;  Notable for the following components:      Result Value   WBC, Wet Prep HPF POC RARE (*)    All other components within normal limits  CBC WITH DIFFERENTIAL/PLATELET - Abnormal; Notable for the following components:   Hemoglobin 10.9 (*)    HCT 34.6 (*)    MCV 79.0 (*)    MCH 24.9 (*)    RDW 17.5 (*)    All other components within normal  limits  GC/CHLAMYDIA PROBE AMP ([place]) NOT AT [facility]    EKG None  Radiology US PELVIC COMPLETE W TRANSVAGINAL AND TORSION R/O  Result Date: 08/03/2020 CLINICAL DATA:  Pelvic pain and bleeding EXAM: TRANSABDOMINAL AND TRANSVAGINAL ULTRASOUND OF PELVIS DOPPLER ULTRASOUND OF OVARIES TECHNIQUE: Both transabdominal and transvaginal ultrasound examinations of the pelvis were performed. Transabdominal technique was performed for global imaging of the pelvis including uterus, ovaries, adnexal regions, and pelvic cul-de-sac. It was necessary to proceed with endovaginal exam following the transabdominal exam to visualize the endometrium and uterus. Color and duplex Doppler ultrasound was utilized to evaluate blood flow to the ovaries. COMPARISON:  CT 07/09/2012 FINDINGS: Uterus Measurements: 12.6 x 6.2 x 8.9 cm = volume: 367 mL. Multiple uterine masses, likely fibroids. Right mid uterine mass measuring 2.6 x 2.1 x 2.6 cm. Lower uterine segment mass measuring 2.4 x 3.2 x 1.8 cm. Anterior subserosal lower uterine segment mass measuring 2 x 1.6 x 2.3 cm. Posterior mid uterine mass measuring 5.2 x 3.5 x 3.9 cm. Multiple additional non measured masses. Complex nabothian cyst in the cervix. Endometrium Thickness: 16 mm.  No focal abnormality visualized. Right ovary Measurements: 3.1 x 2.4 x 2.6 cm = volume: 9.9 mL. Normal appearance/no adnexal mass. Left ovary Measurements: 4.7 x 4.1 x 6.3 cm = volume: 62.9 mL. Anechoic cyst measuring 4.4 x 3.8 x 5 cm Pulsed Doppler evaluation of both ovaries demonstrates normal low-resistance arterial and venous waveforms. Other findings No  abnormal free fluid. IMPRESSION: 1. Endometrial thickness of 16 mm. If bleeding remains unresponsive to hormonal or medical therapy, focal lesion work-up with sonohysterogram should be considered. Endometrial biopsy should also be considered in pre-menopausal patients at high risk for endometrial carcinoma. (Ref: Radiological Reasoning: Algorithmic Workup of Abnormal Vaginal Bleeding with Endovaginal Sonography and Sonohysterography. AJR 2008; 235:T61-44) 2. Negative for ovarian torsion. 3. Enlarged uterus with multiple fibroids. 4. 5 cm left adnexal cyst. No follow up imaging recommended. Note: This recommendation does not apply to premenarchal patients or to those with increased risk (genetic, family history, elevated tumor markers or other high-risk factors) of ovarian cancer. Reference: Radiology 2019 Nov; 293(2):359-371. Electronically Signed   By: Donavan Foil M.D.   On: 08/03/2020 20:14    Procedures Procedures   Medications Ordered in ED Medications - No data to display  ED Course  I have reviewed the triage vital signs and the nursing notes.  Pertinent labs & imaging results that were available during my care of the patient were reviewed by me and considered in my medical decision making (see chart for details).  Clinical Course as of 08/03/20 2116  Mon Aug 03, 2020  2035 Provera 10 mg x 10 days  [MV]    Clinical Course User Index [MV] Eustaquio Maize, PA-C   MDM Rules/Calculators/A&P                          50 year old female presents to the ED today for further evaluation of vaginal bleeding that began yesterday.  Did not have a menstrual cycle since October however began having heavy vaginal bleeding yesterday.  Seen MAU earlier today with a negative pregnancy test.  CBC done earlier at [facility], ED prior to patient leaving without being seen stable.  On arrival to this ED her vitals are stable.  She is afebrile, nontachycardic and nontachypneic.  She appears to be in no  acute distress at this time.  She has no abdominal tenderness palpation  on exam.  We will plan for pelvic exam at this time, will repeat CBC to check to see if hemoglobin is dropping and plan for pelvic ultrasound.  Pelvic exam performed with chaperone at bedside.  Patient is a moderate amount of blood in vault, no hemorrhaging from os noted.  She does have what appears to be a large adnexal mass on exam today which she states her OB/GYN palpated 2 to 3 weeks ago with a history of fibroids I suspect this is likely more the cause however given new onset vaginal bleeding we will plan for ultrasound to assess for any ruptured cyst.  Ultrasound: IMPRESSION:  1. Endometrial thickness of 16 mm. If bleeding remains unresponsive  to hormonal or medical therapy, focal lesion work-up with  sonohysterogram should be considered. Endometrial biopsy should also  be considered in pre-menopausal patients at high risk for  endometrial carcinoma. (Ref: Radiological Reasoning: Algorithmic  Workup of Abnormal Vaginal Bleeding with Endovaginal Sonography and  Sonohysterography. AJR 2008; 741:U38-45)  2. Negative for ovarian torsion.  3. Enlarged uterus with multiple fibroids.  4. 5 cm left adnexal cyst. No follow up imaging recommended. Note:  This recommendation does not apply to premenarchal patients or to  those with increased risk (genetic, family history, elevated tumor  markers or other high-risk factors) of ovarian cancer. Reference:  Radiology 2019 Nov; 293(2):359-371.   Repeat CBC with Hgb 10.9  Discussed case with OBGYN Dr. Charlesetta Garibaldi given abnormal ultrasound findings. Suggests Provera 10 mg x 10 days if no risk factors for PE/DVT which pt does not have. She also recommends close outpatient follow up with plans for likely endometrial biopsy. Have discussed this with pt. She is in agreement to call her OBGYN Dr. Alesia Richards tomorrow to schedule an appointment. Stable for discharge at this time.   This note was  prepared using Dragon voice recognition software and may include unintentional dictation errors due to the inherent limitations of voice recognition software.  Final Clinical Impression(s) / ED Diagnoses Final diagnoses:  Vaginal bleeding  Increased endometrial stripe thickness  Adnexal cyst    Rx / DC Orders ED Discharge Orders         Ordered    medroxyPROGESTERone (PROVERA) 10 MG tablet  Daily        08/03/20 2115           Discharge Instructions     Please pick up medication and take as prescribed to help control the bleeding Call Dr. Fonnie Jarvis office tomorrow to schedule an appointment for further evaluation. They will likely need to do an endometrial biopsy to test for irregular cells in this area that could be contributing to the bleeding       Eustaquio Maize, PA-C 08/03/20 2116    Hayden Rasmussen, MD 08/04/20 1049

## 2020-08-03 NOTE — ED Triage Notes (Signed)
States no period since Oct thought  She was going thru menopause now period started  Bleed and now going thru 3 pads in 2 hours  No BC  Hx of fibroids

## 2020-08-03 NOTE — Discharge Instructions (Addendum)
Please pick up medication and take as prescribed to help control the bleeding Call Dr. Fonnie Jarvis office tomorrow to schedule an appointment for further evaluation. They will likely need to do an endometrial biopsy to test for irregular cells in this area that could be contributing to the bleeding

## 2020-08-03 NOTE — MAU Note (Signed)
Pt to wait in lobby to provide urine sample for UPT. Unable to give sample at this time

## 2020-08-03 NOTE — MAU Note (Signed)
Cassie Ward is a 50 y.o. here in MAU reporting: states she has not had a period since October, yesterday started her period yesterday. States cycle is heavy, is filling up a super tampon and pad every 1-2 hours. Feeling weak. No pain.  LMP: 08/02/20  Onset of complaint: yesterday  Pain score: 0/10  Vitals:   08/03/20 1349  BP: (!) 182/93  Pulse: 69  Resp: 16  Temp: 98.4 F (36.9 C)  SpO2: 99%     Lab orders placed from triage: none

## 2020-08-03 NOTE — ED Notes (Signed)
Pt stated she was leaving had pt advocate push her in the wheelchair back to MAU

## 2020-08-03 NOTE — ED Triage Notes (Signed)
Arrived from MAU; - pregnancy test. C/O vaginal bleeding; reported hx of uterine fibroids.

## 2020-08-04 LAB — GC/CHLAMYDIA PROBE AMP (~~LOC~~) NOT AT ARMC
Chlamydia: NEGATIVE
Comment: NEGATIVE
Comment: NORMAL
Neisseria Gonorrhea: NEGATIVE

## 2020-08-05 ENCOUNTER — Other Ambulatory Visit: Payer: Self-pay | Admitting: Obstetrics & Gynecology

## 2020-08-18 ENCOUNTER — Other Ambulatory Visit: Payer: Self-pay | Admitting: Obstetrics & Gynecology

## 2020-09-17 ENCOUNTER — Other Ambulatory Visit: Payer: Self-pay

## 2020-09-17 ENCOUNTER — Encounter (HOSPITAL_BASED_OUTPATIENT_CLINIC_OR_DEPARTMENT_OTHER): Payer: Self-pay | Admitting: Obstetrics & Gynecology

## 2020-09-17 NOTE — Progress Notes (Addendum)
YOU ARE SCHEDULED FOR A COVID TEST  09-21-1020 @ 1100 am. HIS TEST MUST BE DONE BEFORE SURGERY. GO TO  [facility]. [place], [place], IT IS APPROXIMATELY 2 MINUTES PAST [employer] ON THE RIGHT AND REMAIN IN YOUR CAR, THIS IS A DRIVE UP TEST. ONCE YOUR COVID TEST IS DONE PLEASE FOLLOW ALL THE QUARANTINE  INSTRUCTIONS GIVEN IN YOUR HANDOUT.      Your procedure is scheduled on 09-23-2020  Report to [facility]   Call this number if you have problems the morning of surgery  :[phone].   OUR ADDRESS IS [address].  WE ARE LOCATED IN [facility].  PLEASE BRING YOUR INSURANCE CARD AND PHOTO ID DAY OF SURGERY.  ONLY ONE PERSON ALLOWED IN FACILITY WAITING AREA.                                     REMEMBER:  DO NOT EAT FOOD, CANDY GUM OR MINTS  AFTER MIDNIGHT . YOU MAY HAVE CLEAR LIQUIDS  FROM MIDNIGHT UNTIL 1000   AM. NO CLEAR LIQUIDS AFTER 1000 AM  DAY OF SURGERY.   YOU MAY  BRUSH YOUR TEETH MORNING OF SURGERY AND RINSE YOUR MOUTH OUT, NO CHEWING GUM CANDY OR MINTS.    CLEAR LIQUID DIET   Foods Allowed                                                                     Foods Excluded  Coffee and tea, regular and decaf                             liquids that you cannot  Plain Jell-O any favor except red or purple                                           see through such as: Fruit ices (not with fruit pulp)                                     milk, soups, orange juice  Iced Popsicles                                    All solid food Carbonated beverages, regular and diet                                    Cranberry, grape and apple juices Sports drinks like Gatorade Lightly seasoned clear broth or consume(fat free) Sugar, honey syrup  Sample Menu Breakfast                                Lunch  Supper Cranberry juice                    Beef broth                             Chicken broth Jell-O                                     Grape juice                           Apple juice Coffee or tea                        Jell-O                                      Popsicle                                                Coffee or tea                        Coffee or tea  _____________________________________________________________________     TAKE THESE MEDICATIONS MORNING OF SURGERY WITH A SIP OF WATER:  NONE  ONE VISITOR IS ALLOWED IN WAITING ROOM ONLY DAY OF SURGERY.  NO VISITOR MAY SPEND THE NIGHT.  VISITOR ARE ALLOWED TO STAY UNTIL 800 PM. PLEASE HAVE YOUR DRIVER HOME  ARRIVE AT 900 AM MORNING AFTER SURGERY TO TAKE YOU HOME.                                    DO NOT WEAR JEWERLY, MAKE UP. DO NOT WEAR LOTIONS, POWDERS, PERFUMES OR DEODORANT. DO NOT SHAVE FOR 24 HOURS PRIOR TO DAY OF SURGERY. MEN MAY SHAVE FACE AND NECK. CONTACTS, GLASSES, OR DENTURES MAY NOT BE WORN TO SURGERY.                                    [facility] IS NOT RESPONSIBLE  FOR ANY BELONGINGS.                                                                    Marland Kitchen           [facility] - Preparing for Surgery Before surgery, you can play an important role.  Because skin is not sterile, your skin needs to be as free of germs as possible.  You can reduce the number of germs on your skin by washing with CHG (chlorahexidine gluconate) soap before surgery.  CHG is an antiseptic cleaner which kills germs and bonds with the skin to continue killing germs even after washing. Please DO NOT use  if you have an allergy to CHG or antibacterial soaps.  If your skin becomes reddened/irritated stop using the CHG and inform your nurse when you arrive at Short Stay. Do not shave (including legs and underarms) for at least 48 hours prior to the first CHG shower.  You may shave your face/neck. Please follow these instructions carefully:  1.  Shower with CHG Soap the night before surgery and the  morning of  Surgery.  2.  If you choose to wash your hair, wash your hair first as usual with your  normal  shampoo.  3.  After you shampoo, rinse your hair and body thoroughly to remove the  shampoo.                            4.  Use CHG as you would any other liquid soap.  You can apply chg directly  to the skin and wash                      Gently with a scrungie or clean washcloth.  5.  Apply the CHG Soap to your body ONLY FROM THE NECK DOWN.   Do not use on face/ open                           Wound or open sores. Avoid contact with eyes, ears mouth and genitals (private parts).                       Wash face,  Genitals (private parts) with your normal soap.             6.  Wash thoroughly, paying special attention to the area where your surgery  will be performed.  7.  Thoroughly rinse your body with warm water from the neck down.  8.  DO NOT shower/wash with your normal soap after using and rinsing off  the CHG Soap.                9.  Pat yourself dry with a clean towel.            10.  Wear clean pajamas.            11.  Place clean sheets on your bed the night of your first shower and do not  sleep with pets. Day of Surgery : Do not apply any lotions/deodorants the morning of surgery.  Please wear clean clothes to the hospital/surgery center.  FAILURE TO FOLLOW THESE INSTRUCTIONS MAY RESULT IN THE CANCELLATION OF YOUR SURGERY PATIENT SIGNATURE_________________________________  NURSE SIGNATURE__________________________________  ________________________________________________________________________                                                        QUESTIONS Cassie Ward PRE OP NURSE PHONE [phone]

## 2020-09-17 NOTE — Progress Notes (Addendum)
Spoke w/ via phone for pre-op interview---pt Lab needs dos---- urin preg              Lab results------has lab appt 09-21-2020 945 am for cbc bmp T & S COVID test ------07-22-2020 1100 Arrive at -------1100 am 35-18-2022 NPO after MN NO Solid Food.  Clear liquids from MN until---1000 am then npo Med rec completed Medications to take morning of surgery -----none Diabetic medication -----n/a Patient instructed to bring photo id and insurance card day of surgery Patient aware to have Driver (ride ) / caregiver spouse Roderic Palau will stay    for 24 hours after surgery  Patient Special Instructions -----pt given overnight stay instructions Pre-Op special Istructions -----none Patient verbalized understanding of instructions that were given at this phone interview. Patient denies shortness of breath, chest pain, fever, cough at this phone interview.  ekg 07-03-2020 dr Lenna Sciara [facility] on chart lov dr Marton [facility] cardiology 07-31-2020 [facility] cardiology on chart Echo 07-22-2020 [facility] cardiology on chart

## 2020-09-21 ENCOUNTER — Other Ambulatory Visit: Payer: Self-pay

## 2020-09-21 ENCOUNTER — Encounter (HOSPITAL_COMMUNITY)
Admission: RE | Admit: 2020-09-21 | Discharge: 2020-09-21 | Disposition: A | Discharge status: Home / Self Care | Payer: BC Managed Care – PPO | Source: Ambulatory Visit | Attending: Obstetrics & Gynecology | Admitting: Obstetrics & Gynecology

## 2020-09-21 ENCOUNTER — Other Ambulatory Visit (HOSPITAL_COMMUNITY)
Admission: RE | Admit: 2020-09-21 | Discharge: 2020-09-21 | Disposition: A | Discharge status: Home / Self Care | Payer: BC Managed Care – PPO | Source: Ambulatory Visit | Attending: Obstetrics & Gynecology | Admitting: Obstetrics & Gynecology

## 2020-09-21 DIAGNOSIS — Z20822 Contact with and (suspected) exposure to covid-19: Secondary | ICD-10-CM | POA: Insufficient documentation

## 2020-09-21 DIAGNOSIS — Z01812 Encounter for preprocedural laboratory examination: Secondary | ICD-10-CM | POA: Insufficient documentation

## 2020-09-21 LAB — BASIC METABOLIC PANEL
Anion gap: 6 (ref 5–15)
BUN: 15 mg/dL (ref 6–20)
CO2: 28 mmol/L (ref 22–32)
Calcium: 9.7 mg/dL (ref 8.9–10.3)
Chloride: 104 mmol/L (ref 98–111)
Creatinine, Ser: 0.96 mg/dL (ref 0.44–1.00)
GFR, Estimated: >60 mL/min (ref >60)
Glucose, Bld: 102 mg/dL — ABNORMAL HIGH (ref 70–99)
Potassium: 4.1 mmol/L (ref 3.5–5.1)
Sodium: 138 mmol/L (ref 135–145)

## 2020-09-21 LAB — CBC
HCT: 39 % (ref 36.0–46.0)
Hemoglobin: 11.8 g/dL — ABNORMAL LOW (ref 12.0–15.0)
MCH: 24.6 pg — ABNORMAL LOW (ref 26.0–34.0)
MCHC: 30.3 g/dL (ref 30.0–36.0)
MCV: 81.4 fL (ref 80.0–100.0)
Platelets: 319 10*3/uL (ref 150–400)
RBC: 4.79 MIL/uL (ref 3.87–5.11)
RDW: 15.5 % (ref 11.5–15.5)
WBC: 4.6 10*3/uL (ref 4.0–10.5)
nRBC: 0 % (ref 0.0–0.2)

## 2020-09-22 LAB — SARS CORONAVIRUS 2 (TAT 6-24 HRS): SARS Coronavirus 2: NEGATIVE

## 2020-09-22 NOTE — H&P (Signed)
Cassie Ward is an 50 y.o. female G2P1011 with a long history of symptomatic fibroids and endometrial polyps with heavy and painful periods as well as pelvic pain.  She has failed conservative therapies of hormonal medication, Dilation and Currettage Hysteroscopy and abdominal myomectomy.  She desires definitive therapy via a hysterectomy.  She also desires opportunistic removal of her fallopian tubes as well as removal of an ovarian cyst.  She desires conservation of her ovaries if they appear normal.    Pertinent Gynecological History: Menses:  Flow is excessive sometimes having to change 1 pad in an hour and bleeding lasting more than 1 week.  Irregular periods  Bleeding: Prolonged periods. Contraception: Condoms DES exposure: unknown Blood transfusions: none Sexually transmitted diseases: no past history Previous GYN Procedures: As noted above  Last mammogram: normal Date: 07/13/2020 Last pap: normal Date: 07/13/2020 OB History: G2 P1011   Menstrual History: Patient's last menstrual period was 07/18/2020.    Past Medical History:  Diagnosis Date  . Anemia   . Concussion 2018   no residual   . COVID 07/2017   fever body aches loss of taste and smell x 2 weeks all symptoms resolved except ooc body aches  . Dysrhythmia    irregular heart beats on occ   . Fibroids 2010  . Headache    Migraine  . Hypertension   . Wears contact lenses     Past Surgical History:  Procedure Laterality Date  . DILATATION & CURRETTAGE/HYSTEROSCOPY WITH RESECTOCOPE N/A 08/23/2012   Procedure: [place];  Surgeon: Ena Dawley, MD;  Location: [facility];  Service: Gynecology;  Laterality: N/A;  Hysteroscopy with Resection of Submucosal Fibroid; D&C - 60 minutes  . MYOMECTOMY N/A 04/28/2015   Procedure: ABDOMINAL MYOMECTOMY;  Surgeon: Ena Dawley, MD;  Location: [facility];  Service: Gynecology;  Laterality: N/A;  . TONSILECTOMY/ADENOIDECTOMY WITH MYRINGOTOMY   tonsils age 54  . vaginal deliveries  53    Family History  Problem Relation Age of Onset  . Cancer Mother        Breast    Social History:  reports that she has never smoked. She has never used smokeless tobacco. She reports current alcohol use. She reports that she does not use drugs.  Allergies: No Known Allergies  No medications prior to admission.  No current facility-administered medications for this encounter.  Current Outpatient Medications:  .  hydrochlorothiazide (MICROZIDE) 12.5 MG capsule, Take 12.5 mg by mouth daily., Disp: , Rfl:  .  Multiple Vitamin (MULTIVITAMIN) capsule, Take 1 capsule by mouth daily., Disp: , Rfl:   Review of Systems  Constitutional: Denies fevers/chills Cardiovascular: Denies chest pain or palpitations Pulmonary: Denies coughing or wheezing Gastrointestinal: Denies nausea, vomiting or diarrhea Genitourinary: Denies pelvic pain.  With unusual vaginal bleeding.  Denies unusual vaginal discharge, dysuria, urgency or frequency.  Musculoskeletal: Denies muscle or joint aches and pain.  Neurology: Denies abnormal sensations such as tingling or numbness.    Height 5\' 4"  (1.626 m), weight 84.4 kg, last menstrual period 07/18/2020.  BMI 31.24 Vitals:   09/17/20 1303 09/23/20 1149 09/23/20 1200  BP:  (!) 158/101 (!) 156/95  Pulse:  68 65  Resp:  16   Temp:  98.1 F (36.7 C)   TempSrc:  Oral   SpO2:  100% 100%  Weight: 84.4 kg 82.6 kg   Height: 5\' 4"  (1.626 m) 5\' 4"  (1.626 m)    Physical Exam Constitutional: She is oriented to person, place, and time.  She appears well-developed and well-nourished.  HENT:  Head: Normocephalic and atraumatic.  Eyes: Conjunctivae and EOM are normal.  Neck: Normal range of motion. Neck supple.  Cardiovascular: Normal rate, regular rhythm, normal heart sounds and intact distal pulses.  Respiratory: Effort normal and breath sounds normal.  GI: Soft. She exhibits no mass. There is no tenderness.  Genitourinary:  Vagina normal and uterus is 14 week sized.   Musculoskeletal: Normal range of motion.  Neurological: She is alert and oriented to person, place, and time.  Skin: Skin is warm and dry.  Psychiatric: She has a normal mood and affect. Judgment normal.   Recent Results (from the past 2160 hour(s))  Pregnancy, urine POC     Status: None   Collection Time: 08/03/20  2:06 PM  Result Value Ref Range   Preg Test, Ur NEGATIVE NEGATIVE    Comment:        THE SENSITIVITY OF THIS METHODOLOGY IS >24 mIU/mL   CBC with Differential     Status: Abnormal   Collection Time: 08/03/20  2:59 PM  Result Value Ref Range   WBC 5.0 4.0 - 10.5 K/uL   RBC 4.62 3.87 - 5.11 MIL/uL   Hemoglobin 11.7 (L) 12.0 - 15.0 g/dL   HCT 37.0 36.0 - 46.0 %   MCV 80.1 80.0 - 100.0 fL   MCH 25.3 (L) 26.0 - 34.0 pg   MCHC 31.6 30.0 - 36.0 g/dL   RDW 17.9 (H) 11.5 - 15.5 %   Platelets 345 150 - 400 K/uL   nRBC 0.0 0.0 - 0.2 %   Neutrophils Relative % 50 %   Neutro Abs 2.5 1.7 - 7.7 K/uL   Lymphocytes Relative 37 %   Lymphs Abs 1.9 0.7 - 4.0 K/uL   Monocytes Relative 8 %   Monocytes Absolute 0.4 0.1 - 1.0 K/uL   Eosinophils Relative 5 %   Eosinophils Absolute 0.3 0.0 - 0.5 K/uL   Basophils Relative 0 %   Basophils Absolute 0.0 0.0 - 0.1 K/uL   Immature Granulocytes 0 %   Abs Immature Granulocytes 0.01 0.00 - 0.07 K/uL    Comment: Performed at [facility], [address] [place], [place] [address]  Comprehensive metabolic panel     Status: None   Collection Time: 08/03/20  2:59 PM  Result Value Ref Range   Sodium 138 135 - 145 mmol/L   Potassium 4.1 3.5 - 5.1 mmol/L   Chloride 104 98 - 111 mmol/L   CO2 26 22 - 32 mmol/L   Glucose, Bld 84 70 - 99 mg/dL    Comment: Glucose reference range applies only to samples taken after fasting for at least 8 hours.   BUN 7 6 - 20 mg/dL   Creatinine, Ser 0.77 0.44 - 1.00 mg/dL   Calcium 9.3 8.9 - 10.3 mg/dL   Total Protein 7.8 6.5 - 8.1 g/dL   Albumin 4.1 3.5 - 5.0  g/dL   AST 19 15 - 41 U/L   ALT 12 0 - 44 U/L   Alkaline Phosphatase 55 38 - 126 U/L   Total Bilirubin 0.5 0.3 - 1.2 mg/dL   GFR, Estimated >60 >60 mL/min    Comment: (NOTE) Calculated using the CKD-EPI Creatinine Equation (2021)    Anion gap 8 5 - 15    Comment: Performed at [facility] [address] [place], [place] [address]  I-Stat beta hCG blood, ED     Status: None  Collection Time: 08/03/20  4:33 PM  Result Value Ref Range   I-stat hCG, quantitative <5.0 <5 mIU/mL   Comment 3            Comment:   GEST. AGE      CONC.  (mIU/mL)   <=1 WEEK        5 - 50     2 WEEKS       50 - 500     3 WEEKS       100 - 10,000     4 WEEKS     1,000 - 30,000        FEMALE AND NON-PREGNANT FEMALE:     LESS THAN 5 mIU/mL   GC/Chlamydia probe amp     Status: None   Collection Time: 08/03/20  6:18 PM  Result Value Ref Range   Neisseria Gonorrhea Negative    Chlamydia Negative    Comment Normal Reference Ranger Chlamydia - Negative    Comment      Normal Reference Range Neisseria Gonorrhea - Negative  Wet prep, genital     Status: Abnormal   Collection Time: 08/03/20  6:48 PM   Specimen: PATH Cytology Cervicovaginal Ancillary Only  Result Value Ref Range   Yeast Wet Prep HPF POC NONE SEEN NONE SEEN   Trich, Wet Prep NONE SEEN NONE SEEN   Clue Cells Wet Prep HPF POC NONE SEEN NONE SEEN   WBC, Wet Prep HPF POC RARE (A) NONE SEEN   Sperm NONE SEEN     Comment: Performed at [facility], [address] [place], [place] [address]  CBC with Differential     Status: Abnormal   Collection Time: 08/03/20  8:32 PM  Result Value Ref Range   WBC 6.4 4.0 - 10.5 K/uL   RBC 4.38 3.87 - 5.11 MIL/uL   Hemoglobin 10.9 (L) 12.0 - 15.0 g/dL   HCT 34.6 (L) 36.0 - 46.0 %   MCV 79.0 (L) 80.0 - 100.0 fL   MCH 24.9 (L) 26.0 - 34.0 pg   MCHC 31.5 30.0 - 36.0 g/dL   RDW 17.5 (H) 11.5 - 15.5 %   Platelets 316 150 - 400 K/uL   nRBC 0.0 0.0 - 0.2 %   Neutrophils Relative % 54 %    Neutro Abs 3.5 1.7 - 7.7 K/uL   Lymphocytes Relative 34 %   Lymphs Abs 2.1 0.7 - 4.0 K/uL   Monocytes Relative 7 %   Monocytes Absolute 0.4 0.1 - 1.0 K/uL   Eosinophils Relative 4 %   Eosinophils Absolute 0.3 0.0 - 0.5 K/uL   Basophils Relative 1 %   Basophils Absolute 0.0 0.0 - 0.1 K/uL   Immature Granulocytes 0 %   Abs Immature Granulocytes 0.01 0.00 - 0.07 K/uL    Comment: Performed at [facility], [address] [place], [place] [address]  Type and screen     Status: None   Collection Time: 09/21/20 10:07 AM  Result Value Ref Range   ABO/RH(D) A POS    Antibody Screen NEG    Sample Expiration 10/05/2020,2359    Extend sample reason      NO TRANSFUSIONS OR PREGNANCY IN THE PAST 3 MONTHS Performed at [facility], [place] [address], [place], [place] [address]   CBC     Status: Abnormal   Collection Time: 09/21/20 10:07 AM  Result Value Ref Range   WBC 4.6 4.0 - 10.5  K/uL   RBC 4.79 3.87 - 5.11 MIL/uL   Hemoglobin 11.8 (L) 12.0 - 15.0 g/dL   HCT 39.0 36.0 - 46.0 %   MCV 81.4 80.0 - 100.0 fL   MCH 24.6 (L) 26.0 - 34.0 pg   MCHC 30.3 30.0 - 36.0 g/dL   RDW 15.5 11.5 - 15.5 %   Platelets 319 150 - 400 K/uL   nRBC 0.0 0.0 - 0.2 %    Comment: Performed at [facility], [place] [address], [place],  [address]  Basic metabolic panel     Status: Abnormal   Collection Time: 09/21/20 10:07 AM  Result Value Ref Range   Sodium 138 135 - 145 mmol/L   Potassium 4.1 3.5 - 5.1 mmol/L   Chloride 104 98 - 111 mmol/L   CO2 28 22 - 32 mmol/L   Glucose, Bld 102 (H) 70 - 99 mg/dL    Comment: Glucose reference range applies only to samples taken after fasting for at least 8 hours.   BUN 15 6 - 20 mg/dL   Creatinine, Ser 0.96 0.44 - 1.00 mg/dL   Calcium 9.7 8.9 - 10.3 mg/dL   GFR, Estimated >60 >60 mL/min    Comment: (NOTE) Calculated using the CKD-EPI Creatinine Equation (2021)    Anion gap 6 5 - 15    Comment: Performed at [facility], [place] [address] [place], [place] [address]  SARS CORONAVIRUS 2 (TAT 6-24 HRS) Nasopharyngeal Nasopharyngeal Swab     Status: None   Collection Time: 09/21/20 11:08 AM   Specimen: Nasopharyngeal Swab  Result Value Ref Range   SARS Coronavirus 2 NEGATIVE NEGATIVE    Comment: (NOTE) SARS-CoV-2 target nucleic acids are NOT DETECTED.  The SARS-CoV-2 RNA is generally detectable in upper and lower respiratory specimens during the acute phase of infection. Negative results do not preclude SARS-CoV-2 infection, do not rule out co-infections with other pathogens, and should not be used as the sole basis for treatment or other patient management decisions. Negative results must be combined with clinical observations, patient history, and epidemiological information. The expected result is Negative.  Fact Sheet for Patients: SugarRoll.be  Fact Sheet for Healthcare Providers: https://www.woods-mathews.com/  This test is not yet approved or cleared by the [place] FDA and  has been authorized for detection and/or diagnosis of SARS-CoV-2 by FDA under an Emergency Use Authorization (EUA). This EUA will remain  in effect (meaning this test can be used) for the duration of the COVID-19 declaration under Se ction 564(b)(1) of the Act, 21 U.S.C. section 360bbb-3(b)(1), unless the authorization is terminated or revoked sooner.  Performed at [facility], [place] [address], [place],  [address]     CLINICAL DATA:  Pelvic pain and bleeding  EXAM: TRANSABDOMINAL AND TRANSVAGINAL ULTRASOUND OF PELVIS  DOPPLER ULTRASOUND OF OVARIES  TECHNIQUE: Both transabdominal and transvaginal ultrasound examinations of the pelvis were performed. Transabdominal technique was performed for global imaging of the pelvis including uterus, ovaries, adnexal regions, and pelvic cul-de-sac.  It was necessary to proceed with endovaginal  exam following the transabdominal exam to visualize the endometrium and uterus. Color and duplex Doppler ultrasound was utilized to evaluate blood flow to the ovaries.  COMPARISON:  CT 07/09/2012  FINDINGS: Uterus  Measurements: 12.6 x 6.2 x 8.9 cm = volume: 367 mL. Multiple uterine masses, likely fibroids. Right mid uterine mass measuring 2.6 x 2.1 x 2.6 cm. Lower uterine segment mass measuring 2.4 x 3.2 x 1.8  cm. Anterior subserosal lower uterine segment mass measuring 2 x 1.6 x 2.3 cm. Posterior mid uterine mass measuring 5.2 x 3.5 x 3.9 cm. Multiple additional non measured masses. Complex nabothian cyst in the cervix.  Endometrium  Thickness: 16 mm.  No focal abnormality visualized.  Right ovary  Measurements: 3.1 x 2.4 x 2.6 cm = volume: 9.9 mL. Normal appearance/no adnexal mass.  Left ovary  Measurements: 4.7 x 4.1 x 6.3 cm = volume: 62.9 mL. Anechoic cyst measuring 4.4 x 3.8 x 5 cm  Pulsed Doppler evaluation of both ovaries demonstrates normal low-resistance arterial and venous waveforms.  Other findings  No abnormal free fluid.  IMPRESSION: 1. Endometrial thickness of 16 mm. If bleeding remains unresponsive to hormonal or medical therapy, focal lesion work-up with sonohysterogram should be considered. Endometrial biopsy should also be considered in pre-menopausal patients at high risk for endometrial carcinoma. (Ref: Radiological Reasoning: Algorithmic Workup of Abnormal Vaginal Bleeding with Endovaginal Sonography and Sonohysterography. AJR 2008; [id number]:[id number]) 2. Negative for ovarian torsion. 3. Enlarged uterus with multiple fibroids. 4. 5 cm left adnexal cyst. No follow up imaging recommended. Note: This recommendation does not apply to premenarchal patients or to those with increased risk (genetic, family history, elevated tumor markers or other high-risk factors) of ovarian cancer. Reference: Radiology 2019 Nov;  293(2):359-371.   Electronically Signed   By: Donavan Foil M.D.   On: 08/03/2020 20:14  Endometrial biopsy 08/05/20: Benign endometrial polyp, no dysplasia, no atypia.    Assessment/Plan: 50 y/o P1 with symptomatic fibroids and polyps here for a definitive therapy via total laparoscopic hysterectomy, opportunistic bilateral salpingectomy, possible left ovarian cystectomy and cystoscopy, possible laparotomy,  This procedure has been fully reviewed with the patient and written informed consent has been obtained.  She understands the hysterectomy will prevent her from carrying a pregnancy in the future.  We discussed risks, benefits and alternatives of laparoscopic hysterectomy to include but not limited to risks of bleeding, infection and damage to organs such as bladder, ureters, bowel and blood vessels which may necessitate additional procedures to be performed in those situations. We discussed risks of vaginal cuff dehiscence. We discussed that intra-op heavy bleeding may require blood transfusion and patient would accept it. We discussed the recovery course and time needed away from work, with a minimum of 2 weeks and up to 6 weeks recommended away from work. we discussed the healing process and possible scar formation. We discussed she may not do heavy lifting for at least 6 weeks after her surgery. We discussed benefits of opportunistic bilateral salpingectomy to include decreasing her risk of tubal and ovarian cancer. We discussed possible need to convert to an abdominal hysterectomy. all her questions were answered and she expressed understanding and agreement.  She would like conservation of her ovaries if they appear normal will want them removed of there is suspicion for malignancy.  We discussed removal of the left ovarian cyst previously seen on imaging at the time of the laparoscopy. All her questions were answered and she expressed understanding.    Archie Endo 09/22/2020, 4:26 PM

## 2020-09-23 ENCOUNTER — Ambulatory Visit (HOSPITAL_BASED_OUTPATIENT_CLINIC_OR_DEPARTMENT_OTHER): Payer: BC Managed Care – PPO | Admitting: Anesthesiology

## 2020-09-23 ENCOUNTER — Encounter (HOSPITAL_COMMUNITY)
Admission: RE | Disposition: A | Discharge status: Home / Self Care | Payer: Self-pay | Source: Home / Self Care | Attending: Obstetrics & Gynecology

## 2020-09-23 ENCOUNTER — Other Ambulatory Visit: Payer: Self-pay

## 2020-09-23 ENCOUNTER — Encounter (HOSPITAL_BASED_OUTPATIENT_CLINIC_OR_DEPARTMENT_OTHER): Payer: Self-pay | Admitting: Obstetrics & Gynecology

## 2020-09-23 ENCOUNTER — Inpatient Hospital Stay (HOSPITAL_BASED_OUTPATIENT_CLINIC_OR_DEPARTMENT_OTHER)
Admission: RE | Admit: 2020-09-23 | Discharge: 2020-09-25 | DRG: 742 | Disposition: A | Discharge status: Home / Self Care | Payer: BC Managed Care – PPO | Attending: Obstetrics & Gynecology | Admitting: Obstetrics & Gynecology

## 2020-09-23 DIAGNOSIS — D259 Leiomyoma of uterus, unspecified: Secondary | ICD-10-CM | POA: Diagnosis present

## 2020-09-23 DIAGNOSIS — E669 Obesity, unspecified: Secondary | ICD-10-CM | POA: Diagnosis present

## 2020-09-23 DIAGNOSIS — N814 Uterovaginal prolapse, unspecified: Secondary | ICD-10-CM | POA: Diagnosis present

## 2020-09-23 DIAGNOSIS — Z5331 Laparoscopic surgical procedure converted to open procedure: Secondary | ICD-10-CM

## 2020-09-23 DIAGNOSIS — N736 Female pelvic peritoneal adhesions (postinfective): Secondary | ICD-10-CM | POA: Diagnosis present

## 2020-09-23 DIAGNOSIS — Z6831 Body mass index (BMI) 31.0-31.9, adult: Secondary | ICD-10-CM

## 2020-09-23 DIAGNOSIS — I10 Essential (primary) hypertension: Secondary | ICD-10-CM | POA: Diagnosis present

## 2020-09-23 DIAGNOSIS — D62 Acute posthemorrhagic anemia: Secondary | ICD-10-CM | POA: Diagnosis not present

## 2020-09-23 DIAGNOSIS — N946 Dysmenorrhea, unspecified: Secondary | ICD-10-CM | POA: Diagnosis present

## 2020-09-23 DIAGNOSIS — D219 Benign neoplasm of connective and other soft tissue, unspecified: Secondary | ICD-10-CM | POA: Diagnosis present

## 2020-09-23 DIAGNOSIS — N840 Polyp of corpus uteri: Secondary | ICD-10-CM | POA: Diagnosis present

## 2020-09-23 DIAGNOSIS — N920 Excessive and frequent menstruation with regular cycle: Secondary | ICD-10-CM | POA: Diagnosis present

## 2020-09-23 DIAGNOSIS — Z8616 Personal history of COVID-19: Secondary | ICD-10-CM | POA: Diagnosis not present

## 2020-09-23 HISTORY — DX: Presence of spectacles and contact lenses: Z97.3

## 2020-09-23 HISTORY — PX: OOPHORECTOMY: SHX6387

## 2020-09-23 HISTORY — PX: CYSTOSCOPY: SHX5120

## 2020-09-23 HISTORY — PX: TOTAL LAPAROSCOPIC HYSTERECTOMY WITH BILATERAL SALPINGO OOPHORECTOMY: SHX6845

## 2020-09-23 HISTORY — PX: LAPAROTOMY: SHX154

## 2020-09-23 HISTORY — PX: LAPAROSCOPY: SHX197

## 2020-09-23 LAB — CBC
HCT: 26.9 % — ABNORMAL LOW (ref 36.0–46.0)
Hemoglobin: 8.2 g/dL — ABNORMAL LOW (ref 12.0–15.0)
MCH: 25.3 pg — ABNORMAL LOW (ref 26.0–34.0)
MCHC: 30.5 g/dL (ref 30.0–36.0)
MCV: 83 fL (ref 80.0–100.0)
Platelets: 194 10*3/uL (ref 150–400)
RBC: 3.24 MIL/uL — ABNORMAL LOW (ref 3.87–5.11)
RDW: 15.6 % — ABNORMAL HIGH (ref 11.5–15.5)
WBC: 12.9 10*3/uL — ABNORMAL HIGH (ref 4.0–10.5)
nRBC: 0 % (ref 0.0–0.2)

## 2020-09-23 LAB — BASIC METABOLIC PANEL
Anion gap: 11 (ref 5–15)
BUN: 11 mg/dL (ref 6–20)
CO2: 27 mmol/L (ref 22–32)
Calcium: 8.9 mg/dL (ref 8.9–10.3)
Chloride: 98 mmol/L (ref 98–111)
Creatinine, Ser: 0.97 mg/dL (ref 0.44–1.00)
GFR, Estimated: >60 mL/min (ref >60)
Glucose, Bld: 154 mg/dL — ABNORMAL HIGH (ref 70–99)
Potassium: 3.6 mmol/L (ref 3.5–5.1)
Sodium: 136 mmol/L (ref 135–145)

## 2020-09-23 LAB — POCT PREGNANCY, URINE: Preg Test, Ur: NEGATIVE

## 2020-09-23 SURGERY — HYSTERECTOMY, TOTAL, LAPAROSCOPIC, WITH BILATERAL SALPINGO-OOPHORECTOMY
Anesthesia: General | Site: Abdomen

## 2020-09-23 MED ORDER — TRIAMTERENE-HCTZ 37.5-25 MG PO TABS
1.0000 | ORAL_TABLET | Freq: Every day | ORAL | Status: DC
  Administered 2020-09-24 – 2020-09-25 (×2): 1 via ORAL
  Filled 2020-09-23 (×2): qty 1

## 2020-09-23 MED ORDER — IBUPROFEN 400 MG PO TABS
600.0000 mg | ORAL_TABLET | Freq: Four times a day (QID) | ORAL | Status: DC
  Administered 2020-09-24 – 2020-09-25 (×3): 600 mg via ORAL
  Filled 2020-09-23 (×3): qty 1

## 2020-09-23 MED ORDER — KETOROLAC TROMETHAMINE 30 MG/ML IJ SOLN: 30.0000 mg | Freq: Four times a day (QID) | INTRAMUSCULAR | Status: DC

## 2020-09-23 MED ORDER — GLYCOPYRROLATE PF 0.2 MG/ML IJ SOSY
PREFILLED_SYRINGE | INTRAMUSCULAR | Status: AC
  Filled 2020-09-23: qty 1

## 2020-09-23 MED ORDER — PROMETHAZINE HCL 25 MG/ML IJ SOLN: 6.2500 mg | INTRAMUSCULAR | Status: DC | PRN

## 2020-09-23 MED ORDER — PHENYLEPHRINE 40 MCG/ML (10ML) SYRINGE FOR IV PUSH (FOR BLOOD PRESSURE SUPPORT)
PREFILLED_SYRINGE | INTRAVENOUS | Status: AC
  Filled 2020-09-23: qty 10

## 2020-09-23 MED ORDER — DEXAMETHASONE SODIUM PHOSPHATE 10 MG/ML IJ SOLN
INTRAMUSCULAR | Status: AC
  Filled 2020-09-23: qty 1

## 2020-09-23 MED ORDER — BUPIVACAINE HCL (PF) 0.25 % IJ SOLN
INTRAMUSCULAR | Status: AC
  Filled 2020-09-23: qty 30

## 2020-09-23 MED ORDER — DOCUSATE SODIUM 100 MG PO CAPS
100.0000 mg | ORAL_CAPSULE | Freq: Two times a day (BID) | ORAL | Status: DC
  Administered 2020-09-23 – 2020-09-25 (×4): 100 mg via ORAL
  Filled 2020-09-23 (×4): qty 1

## 2020-09-23 MED ORDER — EPHEDRINE 5 MG/ML INJ
INTRAVENOUS | Status: AC
  Filled 2020-09-23: qty 10

## 2020-09-23 MED ORDER — MEPERIDINE HCL 25 MG/ML IJ SOLN: 6.2500 mg | INTRAMUSCULAR | Status: DC | PRN

## 2020-09-23 MED ORDER — ACETAMINOPHEN 10 MG/ML IV SOLN
INTRAVENOUS | Status: AC
  Filled 2020-09-23: qty 100

## 2020-09-23 MED ORDER — LACTATED RINGERS IV SOLN: INTRAVENOUS | Status: DC

## 2020-09-23 MED ORDER — ALBUMIN HUMAN 5 % IV SOLN: INTRAVENOUS | Status: DC | PRN

## 2020-09-23 MED ORDER — PHENYLEPHRINE HCL (PRESSORS) 10 MG/ML IV SOLN
INTRAVENOUS | Status: AC
  Filled 2020-09-23: qty 1

## 2020-09-23 MED ORDER — MENTHOL 3 MG MT LOZG: 1.0000 | LOZENGE | OROMUCOSAL | Status: DC | PRN

## 2020-09-23 MED ORDER — PROPOFOL 10 MG/ML IV BOLUS
INTRAVENOUS | Status: AC
  Filled 2020-09-23: qty 20

## 2020-09-23 MED ORDER — OXYCODONE HCL 5 MG PO TABS: 5.0000 mg | ORAL_TABLET | ORAL | Status: DC | PRN

## 2020-09-23 MED ORDER — ROCURONIUM BROMIDE 10 MG/ML (PF) SYRINGE
PREFILLED_SYRINGE | INTRAVENOUS | Status: AC
  Filled 2020-09-23: qty 10

## 2020-09-23 MED ORDER — ONDANSETRON HCL 4 MG/2ML IJ SOLN
INTRAMUSCULAR | Status: DC | PRN
  Administered 2020-09-23: 4 mg via INTRAVENOUS

## 2020-09-23 MED ORDER — NALOXONE HCL 0.4 MG/ML IJ SOLN: 0.4000 mg | INTRAMUSCULAR | Status: DC | PRN

## 2020-09-23 MED ORDER — WHITE PETROLATUM EX OINT
TOPICAL_OINTMENT | CUTANEOUS | Status: AC
  Filled 2020-09-23: qty 5

## 2020-09-23 MED ORDER — FENTANYL CITRATE (PF) 100 MCG/2ML IJ SOLN
INTRAMUSCULAR | Status: AC
  Filled 2020-09-23: qty 2

## 2020-09-23 MED ORDER — LIDOCAINE HCL (CARDIAC) PF 100 MG/5ML IV SOSY
PREFILLED_SYRINGE | INTRAVENOUS | Status: DC | PRN
  Administered 2020-09-23: 100 mg via INTRAVENOUS

## 2020-09-23 MED ORDER — PHENYLEPHRINE HCL-NACL 20-0.9 MG/250ML-% IV SOLN
INTRAVENOUS | Status: DC | PRN
  Administered 2020-09-23: 100 ug/min via INTRAVENOUS

## 2020-09-23 MED ORDER — ONDANSETRON HCL 4 MG/2ML IJ SOLN: 4.0000 mg | Freq: Four times a day (QID) | INTRAMUSCULAR | Status: DC | PRN

## 2020-09-23 MED ORDER — ONDANSETRON HCL 4 MG/2ML IJ SOLN
INTRAMUSCULAR | Status: AC
  Filled 2020-09-23: qty 2

## 2020-09-23 MED ORDER — IBUPROFEN 400 MG PO TABS: 600.0000 mg | ORAL_TABLET | Freq: Four times a day (QID) | ORAL | Status: DC

## 2020-09-23 MED ORDER — GLYCOPYRROLATE 0.2 MG/ML IJ SOLN
INTRAMUSCULAR | Status: DC | PRN
  Administered 2020-09-23: .1 mg via INTRAVENOUS

## 2020-09-23 MED ORDER — HYDROMORPHONE HCL 1 MG/ML IJ SOLN
0.2500 mg | INTRAMUSCULAR | Status: DC | PRN
  Administered 2020-09-23: 0.25 mg via INTRAVENOUS

## 2020-09-23 MED ORDER — CEFAZOLIN SODIUM-DEXTROSE 2-4 GM/100ML-% IV SOLN
2.0000 g | INTRAVENOUS | Status: AC
  Administered 2020-09-23: 2 g via INTRAVENOUS

## 2020-09-23 MED ORDER — EPHEDRINE SULFATE 50 MG/ML IJ SOLN
INTRAMUSCULAR | Status: DC | PRN
  Administered 2020-09-23: 20 mg via INTRAVENOUS
  Administered 2020-09-23 (×3): 10 mg via INTRAVENOUS
  Administered 2020-09-23: 20 mg via INTRAVENOUS

## 2020-09-23 MED ORDER — KETOROLAC TROMETHAMINE 30 MG/ML IJ SOLN
INTRAMUSCULAR | Status: AC
  Filled 2020-09-23: qty 1

## 2020-09-23 MED ORDER — HYDROMORPHONE HCL 2 MG/ML IJ SOLN
INTRAMUSCULAR | Status: AC
  Filled 2020-09-23: qty 1

## 2020-09-23 MED ORDER — MIDAZOLAM HCL 5 MG/5ML IJ SOLN
INTRAMUSCULAR | Status: DC | PRN
  Administered 2020-09-23: 2 mg via INTRAVENOUS

## 2020-09-23 MED ORDER — LIDOCAINE 2% (20 MG/ML) 5 ML SYRINGE
INTRAMUSCULAR | Status: AC
  Filled 2020-09-23: qty 5

## 2020-09-23 MED ORDER — HYDRALAZINE HCL 20 MG/ML IJ SOLN
INTRAMUSCULAR | Status: AC
  Filled 2020-09-23: qty 1

## 2020-09-23 MED ORDER — HYDROMORPHONE HCL 1 MG/ML IJ SOLN
INTRAMUSCULAR | Status: AC
  Filled 2020-09-23: qty 1

## 2020-09-23 MED ORDER — ONDANSETRON HCL 4 MG/2ML IJ SOLN
4.0000 mg | Freq: Four times a day (QID) | INTRAMUSCULAR | Status: DC | PRN
  Administered 2020-09-23: 4 mg via INTRAVENOUS
  Filled 2020-09-23: qty 2

## 2020-09-23 MED ORDER — ONDANSETRON HCL 4 MG PO TABS: 4.0000 mg | ORAL_TABLET | Freq: Four times a day (QID) | ORAL | Status: DC | PRN

## 2020-09-23 MED ORDER — KETOROLAC TROMETHAMINE 30 MG/ML IJ SOLN
INTRAMUSCULAR | Status: DC | PRN
  Administered 2020-09-23: 30 mg via INTRAVENOUS

## 2020-09-23 MED ORDER — HYDROMORPHONE HCL 1 MG/ML IJ SOLN
INTRAMUSCULAR | Status: DC | PRN
  Administered 2020-09-23: 1 mg via INTRAVENOUS
  Administered 2020-09-23 (×2): .5 mg via INTRAVENOUS

## 2020-09-23 MED ORDER — CEFAZOLIN SODIUM-DEXTROSE 2-4 GM/100ML-% IV SOLN
INTRAVENOUS | Status: AC
  Filled 2020-09-23: qty 100

## 2020-09-23 MED ORDER — SODIUM CHLORIDE 0.9% FLUSH: 9.0000 mL | INTRAVENOUS | Status: DC | PRN

## 2020-09-23 MED ORDER — ACETAMINOPHEN 10 MG/ML IV SOLN
INTRAVENOUS | Status: DC | PRN
  Administered 2020-09-23: 1000 mg via INTRAVENOUS

## 2020-09-23 MED ORDER — FERROUS SULFATE 325 (65 FE) MG PO TABS
325.0000 mg | ORAL_TABLET | Freq: Every day | ORAL | Status: DC
  Administered 2020-09-24 – 2020-09-25 (×2): 325 mg via ORAL
  Filled 2020-09-23 (×2): qty 1

## 2020-09-23 MED ORDER — BUPIVACAINE HCL (PF) 0.25 % IJ SOLN
INTRAMUSCULAR | Status: DC | PRN
  Administered 2020-09-23: 16 mL

## 2020-09-23 MED ORDER — ALBUMIN HUMAN 5 % IV SOLN
INTRAVENOUS | Status: AC
  Filled 2020-09-23: qty 500

## 2020-09-23 MED ORDER — MIDAZOLAM HCL 2 MG/2ML IJ SOLN
INTRAMUSCULAR | Status: AC
  Filled 2020-09-23: qty 2

## 2020-09-23 MED ORDER — PROPOFOL 10 MG/ML IV BOLUS
INTRAVENOUS | Status: DC | PRN
  Administered 2020-09-23 (×2): 100 mg via INTRAVENOUS
  Administered 2020-09-23: 200 mg via INTRAVENOUS
  Administered 2020-09-23: 100 mg via INTRAVENOUS

## 2020-09-23 MED ORDER — HYDRALAZINE HCL 20 MG/ML IJ SOLN
INTRAMUSCULAR | Status: DC | PRN
  Administered 2020-09-23 (×2): 10 mg via INTRAVENOUS

## 2020-09-23 MED ORDER — HYDROMORPHONE 1 MG/ML IV SOLN
INTRAVENOUS | Status: DC
  Administered 2020-09-23: 30 mg via INTRAVENOUS
  Administered 2020-09-24: 3.4 mg via INTRAVENOUS
  Administered 2020-09-24: 1.1 mg via INTRAVENOUS
  Administered 2020-09-24: 0.8 mg via INTRAVENOUS
  Filled 2020-09-23: qty 30

## 2020-09-23 MED ORDER — PHENYLEPHRINE HCL (PRESSORS) 10 MG/ML IV SOLN
INTRAVENOUS | Status: DC | PRN
  Administered 2020-09-23 (×7): 80 ug via INTRAVENOUS

## 2020-09-23 MED ORDER — FENTANYL CITRATE (PF) 250 MCG/5ML IJ SOLN
INTRAMUSCULAR | Status: AC
  Filled 2020-09-23: qty 5

## 2020-09-23 MED ORDER — DIPHENHYDRAMINE HCL 50 MG/ML IJ SOLN: 12.5000 mg | Freq: Four times a day (QID) | INTRAMUSCULAR | Status: DC | PRN

## 2020-09-23 MED ORDER — STERILE WATER FOR IRRIGATION IR SOLN
Status: DC | PRN
  Administered 2020-09-23: 1000 mL

## 2020-09-23 MED ORDER — PANTOPRAZOLE SODIUM 40 MG PO TBEC
40.0000 mg | DELAYED_RELEASE_TABLET | Freq: Every day | ORAL | Status: DC
  Administered 2020-09-23 – 2020-09-25 (×3): 40 mg via ORAL
  Filled 2020-09-23 (×3): qty 1

## 2020-09-23 MED ORDER — BISACODYL 10 MG RE SUPP: 10.0000 mg | Freq: Every day | RECTAL | Status: DC | PRN

## 2020-09-23 MED ORDER — HYDROMORPHONE HCL 2 MG PO TABS: 2.0000 mg | ORAL_TABLET | ORAL | Status: DC | PRN

## 2020-09-23 MED ORDER — INDIGOTINDISULFONATE SODIUM 8 MG/ML IJ SOLN
INTRAMUSCULAR | Status: DC | PRN
  Administered 2020-09-23: 5 mL via INTRAVENOUS

## 2020-09-23 MED ORDER — DEXAMETHASONE SODIUM PHOSPHATE 4 MG/ML IJ SOLN
INTRAMUSCULAR | Status: DC | PRN
  Administered 2020-09-23: 10 mg via INTRAVENOUS

## 2020-09-23 MED ORDER — KETOROLAC TROMETHAMINE 30 MG/ML IJ SOLN
30.0000 mg | Freq: Four times a day (QID) | INTRAMUSCULAR | Status: AC
  Administered 2020-09-24 (×4): 30 mg via INTRAVENOUS
  Filled 2020-09-23 (×4): qty 1

## 2020-09-23 MED ORDER — POVIDONE-IODINE 10 % EX SWAB: 2.0000 "application " | Freq: Once | CUTANEOUS | Status: DC

## 2020-09-23 MED ORDER — KETOROLAC TROMETHAMINE 30 MG/ML IJ SOLN: 30.0000 mg | Freq: Once | INTRAMUSCULAR | Status: DC | PRN

## 2020-09-23 MED ORDER — DIPHENHYDRAMINE HCL 12.5 MG/5ML PO ELIX: 12.5000 mg | ORAL_SOLUTION | Freq: Four times a day (QID) | ORAL | Status: DC | PRN

## 2020-09-23 MED ORDER — BUPIVACAINE-EPINEPHRINE 0.25% -1:200000 IJ SOLN
INTRAMUSCULAR | Status: AC
  Filled 2020-09-23: qty 1

## 2020-09-23 MED ORDER — FLEET ENEMA 7-19 GM/118ML RE ENEM: 1.0000 | ENEMA | Freq: Once | RECTAL | Status: DC | PRN

## 2020-09-23 MED ORDER — SODIUM CHLORIDE 0.9 % IR SOLN
Status: DC | PRN
  Administered 2020-09-23: 1000 mL

## 2020-09-23 MED ORDER — FENTANYL CITRATE (PF) 100 MCG/2ML IJ SOLN
INTRAMUSCULAR | Status: DC | PRN
  Administered 2020-09-23 (×3): 50 ug via INTRAVENOUS
  Administered 2020-09-23 (×2): 100 ug via INTRAVENOUS

## 2020-09-23 MED ORDER — ROCURONIUM BROMIDE 100 MG/10ML IV SOLN
INTRAVENOUS | Status: DC | PRN
  Administered 2020-09-23: 20 mg via INTRAVENOUS
  Administered 2020-09-23: 80 mg via INTRAVENOUS

## 2020-09-23 MED ORDER — ACETAMINOPHEN 500 MG PO TABS
1000.0000 mg | ORAL_TABLET | Freq: Four times a day (QID) | ORAL | Status: DC
  Administered 2020-09-24 – 2020-09-25 (×6): 1000 mg via ORAL
  Filled 2020-09-23 (×6): qty 2

## 2020-09-23 MED ORDER — SIMETHICONE 80 MG PO CHEW: 80.0000 mg | CHEWABLE_TABLET | Freq: Four times a day (QID) | ORAL | Status: DC | PRN

## 2020-09-23 MED ORDER — POLYETHYLENE GLYCOL 3350 17 G PO PACK: 17.0000 g | PACK | Freq: Every day | ORAL | Status: DC | PRN

## 2020-09-23 MED ORDER — SUGAMMADEX SODIUM 200 MG/2ML IV SOLN
INTRAVENOUS | Status: DC | PRN
  Administered 2020-09-23: 200 mg via INTRAVENOUS
  Administered 2020-09-23: 100 mg via INTRAVENOUS

## 2020-09-23 SURGICAL SUPPLY — 88 items
ADH SKN CLS APL DERMABOND .7 (GAUZE/BANDAGES/DRESSINGS) ×3
BAG DRN RND TRDRP ANRFLXCHMBR (UROLOGICAL SUPPLIES) ×3
BAG URINE DRAIN 2000ML AR STRL (UROLOGICAL SUPPLIES) ×4 IMPLANT
BARRIER ADHS 3X4 INTERCEED (GAUZE/BANDAGES/DRESSINGS) ×5 IMPLANT
BRR ADH 4X3 ABS CNTRL BYND (GAUZE/BANDAGES/DRESSINGS) ×6
CABLE HIGH FREQUENCY MONO STRZ (ELECTRODE) ×4 IMPLANT
CATH FOLEY 3WAY  5CC 16FR (CATHETERS) ×4
CATH FOLEY 3WAY 5CC 16FR (CATHETERS) ×3 IMPLANT
COVER BACK TABLE 60X90IN (DRAPES) ×4 IMPLANT
COVER MAYO STAND STRL (DRAPES) ×4 IMPLANT
COVER WAND RF STERILE (DRAPES) ×4 IMPLANT
DERMABOND ADVANCED (GAUZE/BANDAGES/DRESSINGS) ×1
DERMABOND ADVANCED .7 DNX12 (GAUZE/BANDAGES/DRESSINGS) ×3 IMPLANT
DEVICE SUTURE ENDOST 10MM (ENDOMECHANICALS) IMPLANT
DISSECTOR BLUNT TIP ENDO 5MM (MISCELLANEOUS) IMPLANT
DRAPE LAPAROTOMY T 102X78X121 (DRAPES) ×4 IMPLANT
DRAPE WARM FLUID 44X44 (DRAPES) IMPLANT
DRSG COVADERM PLUS 2X2 (GAUZE/BANDAGES/DRESSINGS) IMPLANT
DRSG OPSITE POSTOP 3X4 (GAUZE/BANDAGES/DRESSINGS) IMPLANT
DRSG OPSITE POSTOP 4X10 (GAUZE/BANDAGES/DRESSINGS) ×4 IMPLANT
DURAPREP 26ML APPLICATOR (WOUND CARE) ×8 IMPLANT
GAUZE 4X4 16PLY RFD (DISPOSABLE) ×4 IMPLANT
GLOVE SURG ENC MOIS LTX SZ6.5 (GLOVE) ×8 IMPLANT
GLOVE SURG ENC MOIS LTX SZ7 (GLOVE) ×4 IMPLANT
GLOVE SURG POLYISO LF SZ6.5 (GLOVE) ×4 IMPLANT
GLOVE SURG UNDER POLY LF SZ6.5 (GLOVE) ×4 IMPLANT
GLOVE SURG UNDER POLY LF SZ7 (GLOVE) ×16 IMPLANT
GOWN STRL REUS W/TWL LRG LVL3 (GOWN DISPOSABLE) ×12 IMPLANT
HEMOSTAT ARISTA ABSORB 3G PWDR (HEMOSTASIS) ×1 IMPLANT
KIT TURNOVER CYSTO (KITS) ×4 IMPLANT
LIGASURE LAP L-HOOKWIRE 5 44CM (INSTRUMENTS) ×1 IMPLANT
LIGASURE VESSEL 5MM BLUNT TIP (ELECTROSURGICAL) ×4 IMPLANT
MANIPULATOR ADVINCU DEL 3.0 PL (MISCELLANEOUS) ×1 IMPLANT
MANIPULATOR ADVINCU DEL 3.5 PL (MISCELLANEOUS) IMPLANT
MANIPULATOR ADVINCU DEL 4.0 PL (MISCELLANEOUS) IMPLANT
MANIPULATOR VCARE LG CRV RETR (MISCELLANEOUS) IMPLANT
MANIPULATOR VCARE SML CRV RETR (MISCELLANEOUS) IMPLANT
NEEDLE HYPO 22GX1.5 SAFETY (NEEDLE) ×1 IMPLANT
NEEDLE INSUFFLATION 120MM (ENDOMECHANICALS) ×4 IMPLANT
NS IRRIG 1000ML POUR BTL (IV SOLUTION) ×4 IMPLANT
NS IRRIG 500ML POUR BTL (IV SOLUTION) ×4 IMPLANT
PACK BASIN DAY SURGERY FS (CUSTOM PROCEDURE TRAY) ×4 IMPLANT
PACK LAPAROSCOPY BASIN (CUSTOM PROCEDURE TRAY) ×4 IMPLANT
PACK TRENDGUARD 450 HYBRID PRO (MISCELLANEOUS) IMPLANT
PAD OB MATERNITY 4.3X12.25 (PERSONAL CARE ITEMS) ×4 IMPLANT
PENCIL SMOKE EVACUATOR (MISCELLANEOUS) ×4 IMPLANT
POUCH LAPAROSCOPIC INSTRUMENT (MISCELLANEOUS) ×4 IMPLANT
PROTECTOR NERVE ULNAR (MISCELLANEOUS) ×8 IMPLANT
RTRCTR C-SECT PINK 25CM LRG (MISCELLANEOUS) ×1 IMPLANT
SCISSORS LAP 5X35 DISP (ENDOMECHANICALS) ×4 IMPLANT
SET IRRIG Y TYPE TUR BLADDER L (SET/KITS/TRAYS/PACK) IMPLANT
SET SUCTION IRRIG HYDROSURG (IRRIGATION / IRRIGATOR) ×4 IMPLANT
SET TRI-LUMEN FLTR TB AIRSEAL (TUBING) ×1 IMPLANT
SHEARS HARMONIC ACE PLUS 36CM (ENDOMECHANICALS) IMPLANT
SPONGE LAP 18X18 RF (DISPOSABLE) ×7 IMPLANT
STAPLER VISISTAT 35W (STAPLE) IMPLANT
SUT ENDO VLOC 180-0-8IN (SUTURE) IMPLANT
SUT MNCRL AB 4-0 PS2 18 (SUTURE) ×9 IMPLANT
SUT MON AB 4-0 PS1 27 (SUTURE) ×5 IMPLANT
SUT PDS AB 0 CTX 60 (SUTURE) ×1 IMPLANT
SUT PLAIN 2 0 XLH (SUTURE) ×1 IMPLANT
SUT VIC AB 0 CT1 18XCR BRD8 (SUTURE) IMPLANT
SUT VIC AB 0 CT1 27 (SUTURE) ×12
SUT VIC AB 0 CT1 27XBRD ANBCTR (SUTURE) ×9 IMPLANT
SUT VIC AB 0 CT1 36 (SUTURE) ×2 IMPLANT
SUT VIC AB 0 CT1 8-18 (SUTURE)
SUT VIC AB 2-0 CT1 (SUTURE) ×4 IMPLANT
SUT VIC AB 3-0 CT1 27 (SUTURE)
SUT VIC AB 3-0 CT1 TAPERPNT 27 (SUTURE) IMPLANT
SUT VICRYL 0 27 CT2 27 ABS (SUTURE) IMPLANT
SUT VICRYL 0 TIES 12 18 (SUTURE) IMPLANT
SUT VICRYL 0 UR6 27IN ABS (SUTURE) ×4 IMPLANT
SUT VLOC 180 0 9IN  GS21 (SUTURE) ×4
SUT VLOC 180 0 9IN GS21 (SUTURE) ×3 IMPLANT
SYR 10ML LL (SYRINGE) IMPLANT
SYR 50ML LL SCALE MARK (SYRINGE) IMPLANT
SYSTEM CARTER THOMASON II (TROCAR) ×4 IMPLANT
TOWEL OR 17X26 10 PK STRL BLUE (TOWEL DISPOSABLE) ×8 IMPLANT
TRAY FOLEY W/BAG SLVR 14FR LF (SET/KITS/TRAYS/PACK) ×4 IMPLANT
TRENDGUARD 450 HYBRID PRO PACK (MISCELLANEOUS)
TROCAR BLADELESS OPT 5 100 (ENDOMECHANICALS) ×8 IMPLANT
TROCAR PORT AIRSEAL 8X120 (TROCAR) ×1 IMPLANT
TROCAR XCEL NON-BLD 11X100MML (ENDOMECHANICALS) ×4 IMPLANT
TROCAR XCEL NON-BLD 5MMX100MML (ENDOMECHANICALS) IMPLANT
TUBE CONNECTING 12X1/4 (SUCTIONS) ×4 IMPLANT
TUBING EVAC SMOKE HEATED PNEUM (TUBING) IMPLANT
WARMER LAPAROSCOPE (MISCELLANEOUS) ×4 IMPLANT
YANKAUER SUCT BULB TIP NO VENT (SUCTIONS) ×4 IMPLANT

## 2020-09-23 NOTE — Op Note (Incomplete)
Cassie Ward MRN: [id number] DOB: Nov 04, 1970 Date of procedure: 09/23/2020  Pre-op diagnosis:  1. 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   2. Minimal uterine descensus.  3. Endometrial polyps.  4. Dysmenorrhea. 5. Menorrhagia. 6. Pelvic pain.  7. Failed conservative management of hormonal control, abdominal myomectomy and D & C hysteroscopy and now desiring definitive therapy via hysterectomy.    8. Desire to decrease future risk of ovarian and fallopian tube cancer.      Post-op diagnosis: Same as above.   Surgeries:  1. Attempted laparoscopic hysterectomy (Operative laparoscopy with lysis of dense adhesions), converted to a total abdominal Hysterectomy and Bilateral salpingectomy, left oophorectomy.  2. Lysis of dense adhesions.  2. Cystoscopy     Surgeon: Dr. Waymon Amato.    Assistant: Dr. Sanjuana Kava  Attending attestation: I was present and scrubbed and performed the procedure and the Physician as an assistant was required due to the complexity of anatomy.      Anesthesia: General Endotracheal: Dr. Jillyn Hidden   EBL: 500* cc  IV Fluids: 3200 cc  Urine output: 7846 cc   Complications: None.   Findings:  1.12 week sized uterus.  2. Dense adhesions between the uterus and bladder, between the uterus and bowel and between the uterus and pelvic side walls.  3. Normal right tube.   4. Left fallopian tube with adhesions to left ovary.   5. Normal right ovary.   6. Normal bladder with strong bilateral ureteral jets.    7. Normal appendix, normal appearing liver edge. 8. Descending colon with adhesions to uterus, as well as adhesions between transverse colon and back of uterus.  Marland Kitchen  9.Several dark blue lesions in  the subcutaneous layer and in adhesions on posterior side of uterus and bowel, specimens were collected and sent to pathology.    Indications: 50 y/o P1 with a long history of symptomatic fibroids and endometrial polyps who has failed conservative treatment and is now for a  definitive treatment by hysterectomy.  Patient also desires opportunistic bilateral salpingectomy to decrease her future risk of fallopian tube and ovarian cancer.     Procedure: Informed consent was obtained from the patient. She was taken to the operating room where anesthesia was administered.  She was carefully positioned in the operating room table in dorsal lithotomy position with both arms tucked to her sides.  She was prepped and draped in the usual sterile fashion.  A foley catheter was  placed in the bladder. Speculum was placed in the vagina.  Cervix dilated to # 19 Fr Pratt dilator.  Stitch placed on the cervix with 2-0 vicryl.  Advincula arch uterine manipulator was placed with the 3cm medium cervical cup, instilled with 5 cc of water and the cervical suture passed around the cervical cup and tied off.   Attention was then turned to the abdomen. 0.25% marcaine with epinephrine was injected in umbilical area, 96EX skin incision was made, kochers used to spread the fascia and direct entry with visualization made with the 11 mm excel trocar.    Correct placement confirmed with visualization of pelvic contents.  Abdomen was insufflated with gas, maximum gas pressure set was 15 mm Hg and patient placed in Trendelenburg.   A 40mm port was placed about 10 cm lateral to umbulicus on left side and then the 68mm Airseal was placed 2 fingerbreadths medial and superior to anterior superior iliac spine with visuallization. Another 44mm port was placed on the right lower quandrant opposite this latter  trocar also with visualization.  The bowel was moved out of the way with an atraumatic probe and the abdomen was surveyed with above findings.  The ureters could not be indentified due to dense adhesions.  Lysis of adhesions between the uterus and bladder, between uterus and bowel and between uterus and pelvic side walls was done with blunt dissection and ligasure.  The adhesions were noted to be dense and made  identification of pelvic landmarks challenging.  There was progress made with lysis of adhesions however there was still limited exposure to the adnexa and anterior uterus.  It was decided to proceed with a laparotomy at this point for better exposure.  The laparoscopic instruments were removed and attention was turned to the lower abdomen.   A pfannenstiel incision was made with the scalpel over the prior abdominal myomectomy incision and carried through the underlying subcutaneous layer and fascia with the bovie.  Small perforators on the layers were contained with the bovie.  Several dark blue lesions were noted on the subcutaneous layer on her right side and these were isolated and removed and sent for frozen section. The frozen section read was indeterminate, cannot rule out malignancy and a fresh specimen was requested for cytometry and was sent.  Final pathology will take about two days to result.  The fascia was nicked in the midline and the fascia separated from the rectus muscles superiorly, inferiorly and bilaterally.  The rectus muscle was separated in the midline, peritoneum entered and the uterus was inspected and the adhesions noted.  Bowel was packed away and Alexis retractor was placed in.  The uterus could not be elevated out of the uterus due to the dense adhesions. Further lysis of adhesions was done carefully with metzenbaum scissors staying close to the uterus circumferentially.  The right round ligament was identified and ligasure used to ligate and transect it.  The anterior broad ligament was entered with the metzenbaum scissors and incision extended anteriorly to create the bladder flap.  An avascular area was identified in the posterior broad ligament and this was entered with the bovie.  The utero-ovarian ligament and fallopian tube were grasped through this window and clamped and cut. Free tie then suture ligature was placed on the lateral pedicles.  The peritoneum overlying the uterine  arteries were dissected away and uterine artery identified at the utero-cervical junction.  It was doubly clamped and straight kocher placed on lateral uterus. The vessel between kocher and heaney clamp was cut and uterine vessel was doubly ligated.  Similar steps were performed on the left side of the uterus after further lysis of adhesions, matching down from round ligament level to the level of the uterocervical junction.  After transecting the left uteroovarian ligament and fallopian tube, the tube and left ovarian complex which was being held by the heaney inadvertently separated from the infundibulopelvic ligament (IP).  The IP was grasped with a heaney clamp, free tie placed and then suture ligature above the free tie.  Good hemostasis was noted.  The  pubo-vesical-cervical fascia was dissected off the bladder.  Upward traction on the uterine manipulator was applied and the uterus was then transected over the cervical cup with the bovie and handed off as specimen. The edges of the vagina were grasped with Kockers and Heaney clamps. The vaginal cuff was closed in a running locked stitch with ensuring to incorporate the uterosacral ligaments bilaterally in the suture.  A small area that bled on the left side of  the pelvic wall was contained with 0 vicryl sutures.  Irrigation was applied and suctioned out. Arista and then interceed were placed over the cuff and peritoneal surfaces.  The muscle and peritoneum was reapproximated with 0-chromic interrupted stitches.  The fascia was closed off using 0-PDS in a running stitch. The subcutaneous layer was closed off with plain suture.  The infraumbilical incision fascia was closed with 0-vicrly.  All the skin was closed off with 4-0 monocryl in a subcuticular stitch.    Dermabond was placed over the incisions, honey comb dressing placed over pfannenstiel incision.  Cystoscopy was performed with normal bladder noted as well as strong bilateral ureteral jets noted.  The  foley catheter was replaced and the patient was awoken from anesthesia and taken to the recovery room in stable condition.    SPECIMEN:  1. Uterus with cervix 2. Left and right fallopian tubes.  3. Left ovary.   4.Dark blue lesions from the right subcutaneous area and adhesions on the right side of pelvis, suspect endometriosis.

## 2020-09-23 NOTE — Interval H&P Note (Signed)
History and Physical Interval Note:  09/23/2020 12:39 PM  Cassie Ward  has presented today for surgery, with the diagnosis of fibroids, ovarian cyst, pelvic pain.  The various methods of treatment have been discussed with the patient and family. After consideration of risks, benefits and other options for treatment, the patient has consented to  Procedure(s): TOTAL LAPAROSCOPIC HYSTERECTOMY WITH BILATERAL SALPINGECTOMY, LEFT OVARIAN CYSTECTOMY, POSSIBLE BILATERAL OOPHORECTOMY, CYSTOSCOPY (N/A), POSSIBLE  EXPLORATORY LAPAROTOMY (N/A) as a surgical intervention.  The patient's history has been reviewed, patient examined, no change in status, stable for surgery.  I have reviewed the patient's chart and labs.  Questions were answered to the patient's satisfaction.    Pollyann Samples [facility] 09/23/2020.  12:42 pm.

## 2020-09-23 NOTE — Anesthesia Postprocedure Evaluation (Signed)
Anesthesia Post Note  Patient: Cassie Ward  Procedure(s) Performed: [id number] TOTAL  ABDOMINAL HYSTERECTOMY, TOTAL  ABDOMINAL HYSTERECTOMY WITH SALPINGECTOMY (Bilateral Abdomen) LAPAROSCOPY OPERATIVE (N/A ) CYSTOSCOPY (N/A ) EXPLORATORY LAPAROTOMY (N/A ) OOPHORECTOMY (Left Abdomen)     Patient location during evaluation: PACU Anesthesia Type: General Level of consciousness: awake and alert Pain management: pain level controlled Vital Signs Assessment: post-procedure vital signs reviewed and stable Respiratory status: spontaneous breathing, nonlabored ventilation, respiratory function stable and patient connected to nasal cannula oxygen Cardiovascular status: blood pressure returned to baseline and stable Postop Assessment: no apparent nausea or vomiting Anesthetic complications: no   No complications documented.  Last Vitals:  Vitals:   09/23/20 1900 09/23/20 1912  BP: 119/77   Pulse: 98   Resp: 10   Temp:  (!) 36.4 C  SpO2: 99%     Last Pain:  Vitals:   09/23/20 1830  TempSrc:   PainSc: [place]

## 2020-09-23 NOTE — Anesthesia Preprocedure Evaluation (Signed)
Anesthesia Evaluation  Patient identified by MRN, date of birth, ID band Patient awake    Reviewed: Allergy & Precautions, NPO status , Patient's Chart, lab work & pertinent test results  Airway Mallampati: I       Dental no notable dental hx.    Pulmonary neg pulmonary ROS,    Pulmonary exam normal        Cardiovascular hypertension, Pt. on medications Normal cardiovascular exam     Neuro/Psych  Headaches, negative psych ROS   GI/Hepatic negative GI ROS, Neg liver ROS,   Endo/Other  negative endocrine ROS  Renal/GU      Musculoskeletal   Abdominal (+) + obese,   Peds  Hematology   Anesthesia Other Findings   Reproductive/Obstetrics negative OB ROS                             Anesthesia Physical Anesthesia Plan  ASA: II  Anesthesia Plan: General   Post-op Pain Management:    Induction: Intravenous  PONV Risk Score and Plan: 4 or greater and Ondansetron, Dexamethasone and Midazolam  Airway Management Planned: Oral ETT  Additional Equipment: None  Intra-op Plan:   Post-operative Plan: Extubation in OR  Informed Consent: I have reviewed the patients History and Physical, chart, labs and discussed the procedure including the risks, benefits and alternatives for the proposed anesthesia with the patient or authorized representative who has indicated his/her understanding and acceptance.     Dental advisory given  Plan Discussed with: CRNA  Anesthesia Plan Comments:         Anesthesia Quick Evaluation

## 2020-09-23 NOTE — Op Note (Addendum)
Cassie Ward MRN: [id number] DOB: 1970-10-17 Date of procedure: 09/23/2020  Pre-op diagnosis:  1. 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   2. Minimal uterine descensus.  3. Endometrial polyps.  4. Dysmenorrhea. 5. Menorrhagia. 6. Pelvic pain.  7. Failed conservative management of hormonal control, abdominal myomectomy and D & C hysteroscopy and now desiring definitive therapy via hysterectomy.    8. Desire to decrease future risk of ovarian and fallopian tube cancer. 9. Anemia with pre-op Hgb 11.7.       Post-op diagnosis: Same as above and  10. Dense abdominal and pelvic adhesions.    Surgeries:  1. Attempted laparoscopic hysterectomy (Operative laparoscopy with extensive lysis of dense adhesions), converted to a total abdominal hysterectomy and bilateral salpingectomy, left oophorectomy, exploratory laparotomy.  2. Extensive lysis of dense adhesions.  2. Cystoscopy     Surgeon: Dr. Waymon Amato    Assistant: Dr. Sanjuana Kava  Attending attestation: I was present and scrubbed and performed the procedure and the Physician as an assistant was required due to the complexity of anatomy      Anesthesia: General Endotracheal: Dr. Jillyn Hidden   EBL: 750 cc  IV Fluids: 3200 cc  Urine output: 7510 cc   Complications: Dense pelvic adhesions that necessitated conversion to a laparotomy    Findings:  1.12 week sized uterus  2. Dense adhesions between the uterus and bladder, between the uterus and bowel and between the uterus and pelvic side walls  3. Normal right tube   4. Left fallopian tube with dense adhesions to left ovary. Friable left ovary and tube    5. Normal right ovary   6. Normal bladder with strong bilateral ureteral jets    7. Normal appendix, normal appearing liver edge 8. Dense adhesions between the bowel and posterior side of the uterus   9.Several dark blue lesions in  the subcutaneous layer and in adhesions on posterior side of uterus and bowel, specimens were collected  and sent to pathology.    Indications: 50 y/o P1 with a long history of symptomatic fibroids and endometrial polyps who has failed conservative treatment and is now for a definitive treatment by hysterectomy.  Patient also desires opportunistic bilateral salpingectomy to decrease her future risk of fallopian tube and ovarian cancer.     Procedure: Informed consent was obtained from the patient. She was taken to the operating room where anesthesia was administered.  She was carefully positioned in the operating room table in dorsal lithotomy position with both arms tucked to her sides.  She was prepped and draped in the usual sterile fashion.  A foley catheter was  placed in the bladder. Speculum was placed in the vagina.  Cervix dilated to # 19 Fr Pratt dilator.  Stitch placed on the cervix with 2-0 vicryl.  Advincula arch uterine manipulator was placed with the 3cm medium cervical cup, instilled with 5 cc of water and the cervical suture passed around the cervical cup and tied off. Attention was then turned to the abdomen. 0.25% marcaine with epinephrine was injected in umbilical area, 25EN skin incision was made, kochers used to spread the fascia and direct entry with visualization made with the 11 mm excel trocar.  Correct placement confirmed with visualization of pelvic contents.  Abdomen was insufflated with gas, maximum gas pressure set was 15 mm Hg and patient placed in Trendelenburg.   A 28mm port was placed about 10 cm lateral to umbilicus on left side and then the 53mm Airseal was placed  2 fingerbreadths medial and superior to anterior superior iliac spine with visualization in the left lower quadrant. Another 63mm port was placed on the right lower quandrant opposite this latter trocar also with visualization.  The bowel was moved out of the way with an atraumatic probe and the abdomen was surveyed with above findings.  The ureters could not be indentified due to dense adhesions.  Careful lysis of  adhesions between the uterus and bladder, between uterus and bowel and between uterus and pelvic side walls was done with blunt dissection and ligasure, staying close to the uterus.  There was progress made with lysis of adhesions and the right fallopian tube was then freed and transected from the mesosalpinx with the ligasure. However there was still limited exposure to the adnexa and anterior/posterior uterus.  Decision was made to proceed with a laparotomy due to limited exposure.  The laparoscopic instruments were removed and attention was turned to the lower abdomen.   A pfannenstiel incision was made with the scalpel over the prior abdominal myomectomy incision and carried through the underlying subcutaneous layer and fascia with the bovie.  Small perforators on the layers were contained with the bovie.  Several dark blue lesions were noted on the subcutaneous layer on her right side and these were isolated and removed and sent for frozen section. The frozen section read was indeterminate, cannot rule out malignancy and a fresh specimen was requested for cytometry and was sent.  Final pathology will take about two days to result.  The fascia was nicked in the midline and the fascia separated from the rectus muscles superiorly, inferiorly and bilaterally.  The rectus muscle was separated in the midline, peritoneum entered and the uterus was inspected and the adhesions noted.  Bowel was packed away and Alexis retractor was placed in.  The uterus could not be elevated out of the uterus due to the dense adhesions. Further lysis of adhesions was done carefully with metzenbaum scissors staying close to the uterus circumferentially.  The right round ligament was identified and ligasure used to ligate and transect it.  The anterior broad ligament was entered with the metzenbaum scissors and incision extended anteriorly to create the bladder flap.  Another dark blue lesion in the adhesions between the posterior uterus  on the right side and the bowel was isolated and removed to be sent to pathology.   An avascular area was identified in the posterior broad ligament and this was entered with the bovie.  The utero-ovarian ligament and fallopian tube were grasped through this window and clamped and cut. Free tie then suture ligature was placed on the lateral pedicles.  The peritoneum overlying the uterine arteries were dissected away and uterine artery identified at the utero-cervical junction.  It was doubly clamped and straight kocher placed on lateral uterus. The vessel between kocher and heaney clamp was cut and uterine vessel was doubly ligated.  The right fallopian tube was then excised from the remaining mesosalpinx attachments.  Similar steps were performed on the left side of the uterus after further lysis of adhesions, matching down from round ligament level to the level of the uterocervical junction.  After transecting the left uteroovarian ligament and fallopian tube, the tube and left ovarian complex which was being held by the heaney inadvertently separated from the infundibulopelvic ligament (IP).  This adnexa had been noted to have dense adhesions and with friable tissue. The IP was grasped with a heaney clamp, free tie placed and then suture ligature  placed above the free tie.  Good hemostasis was noted.  The  pubo-vesical-cervical fascia was dissected off the bladder.  Upward traction on the uterine manipulator was applied and the uterus was then transected over the cervical cup with the bovie and handed off as specimen. The edges of the vagina were grasped with Kockers and Heaney clamps. The vaginal cuff was closed in a running locked stitch after incorporating the uterosacral ligaments bilaterally in the suture.  A small area that bled on the left side of the pelvic wall was contained with 0 vicryl sutures.  Irrigation was applied and suctioned out. Arista and then interceed were placed over the cuff and  peritoneal surfaces with excellent hemostasis noted.  The muscle and peritoneum was reapproximated with 0-chromic interrupted stitches.  The fascia was closed off using 0-PDS in a running stitch. The subcutaneous layer was closed off with plain suture.  The infraumbilical incision fascia was closed with 0-vicrly.  All the skin incisions were closed off with 4-0 monocryl in a subcuticular stitch.    Dermabond was placed over the incisions, honey comb dressing placed over pfannenstiel incision.  Cystoscopy was performed with normal bladder noted as well as strong bilateral ureteral jets noted.  The foley catheter was replaced in the bladder and the patient was awoken from anesthesia and taken to the recovery room in stable condition.    SPECIMENS:  1. Uterus with cervix 2. Left and right fallopian tubes.  3. Left ovary.  4.Dark blue lesions from the right subcutaneous area and adhesions on the right side of pelvis, I suspect endometriosis.  Waymon Amato, MD. 09/25/2020 1810.

## 2020-09-23 NOTE — Transfer of Care (Signed)
Immediate Anesthesia Transfer of Care Note  Patient: Cassie Ward  Procedure(s) Performed: Procedure(s) (LRB): ATTEMPTEDLAPAROSCOPIC TOTAL  ABDOMINAL HYSTERECTOMY, TOTAL  ABDOMINAL HYSTERECTOMY WITH SALPINGECTOMY (Bilateral) LAPAROSCOPY OPERATIVE (N/A) CYSTOSCOPY (N/A) EXPLORATORY LAPAROTOMY (N/A) OOPHORECTOMY (Left)  Patient Location: PACU  Anesthesia Type: General  Level of Consciousness: awake, sedated, patient cooperative and responds to stimulation  Airway & Oxygen Therapy: Patient Spontanous Breathing and Patient connected to [place] 02 and soft FM   Post-op Assessment: Report given to PACU RN, Post -op Vital signs reviewed and stable and Patient moving all extremities  Post vital signs: Reviewed and stable  Complications: No apparent anesthesia complications

## 2020-09-23 NOTE — Anesthesia Procedure Notes (Signed)
Procedure Name: Intubation Date/Time: 09/23/2020 1:20 PM Performed by: Justice Rocher, CRNA Pre-anesthesia Checklist: Patient identified, Emergency Drugs available, Suction available, Patient being monitored and Timeout performed Patient Re-evaluated:Patient Re-evaluated prior to induction Oxygen Delivery Method: Circle system utilized Preoxygenation: Pre-oxygenation with 100% oxygen Induction Type: IV induction Ventilation: Mask ventilation without difficulty Laryngoscope Size: Mac and 3 Grade View: Grade II Tube type: Oral Tube size: 7.5 mm Number of attempts: 1 Airway Equipment and Method: Stylet and Oral airway Placement Confirmation: ETT inserted through vocal cords under direct vision,  positive ETCO2 and breath sounds checked- equal and bilateral Secured at: 23 cm Tube secured with: Tape Dental Injury: Teeth and Oropharynx as per pre-operative assessment

## 2020-09-24 ENCOUNTER — Encounter (HOSPITAL_BASED_OUTPATIENT_CLINIC_OR_DEPARTMENT_OTHER): Payer: Self-pay | Admitting: Obstetrics & Gynecology

## 2020-09-24 LAB — BASIC METABOLIC PANEL
Anion gap: 9 (ref 5–15)
BUN: 10 mg/dL (ref 6–20)
CO2: 24 mmol/L (ref 22–32)
Calcium: 8.5 mg/dL — ABNORMAL LOW (ref 8.9–10.3)
Chloride: 99 mmol/L (ref 98–111)
Creatinine, Ser: 0.71 mg/dL (ref 0.44–1.00)
GFR, Estimated: >60 mL/min (ref >60)
Glucose, Bld: 108 mg/dL — ABNORMAL HIGH (ref 70–99)
Potassium: 3.8 mmol/L (ref 3.5–5.1)
Sodium: 132 mmol/L — ABNORMAL LOW (ref 135–145)

## 2020-09-24 LAB — CBC WITH DIFFERENTIAL/PLATELET
Abs Immature Granulocytes: 0.03 10*3/uL (ref 0.00–0.07)
Basophils Absolute: 0 10*3/uL (ref 0.0–0.1)
Basophils Relative: 0 %
Eosinophils Absolute: 0 10*3/uL (ref 0.0–0.5)
Eosinophils Relative: 0 %
HCT: 23.3 % — ABNORMAL LOW (ref 36.0–46.0)
Hemoglobin: 7.3 g/dL — ABNORMAL LOW (ref 12.0–15.0)
Immature Granulocytes: 0 %
Lymphocytes Relative: 6 %
Lymphs Abs: 0.5 10*3/uL — ABNORMAL LOW (ref 0.7–4.0)
MCH: 25.7 pg — ABNORMAL LOW (ref 26.0–34.0)
MCHC: 31.3 g/dL (ref 30.0–36.0)
MCV: 82 fL (ref 80.0–100.0)
Monocytes Absolute: 0.4 10*3/uL (ref 0.1–1.0)
Monocytes Relative: 5 %
Neutro Abs: 7.2 10*3/uL (ref 1.7–7.7)
Neutrophils Relative %: 89 %
Platelets: 241 10*3/uL (ref 150–400)
RBC: 2.84 MIL/uL — ABNORMAL LOW (ref 3.87–5.11)
RDW: 15.8 % — ABNORMAL HIGH (ref 11.5–15.5)
WBC: 8.2 10*3/uL (ref 4.0–10.5)
nRBC: 0 % (ref 0.0–0.2)

## 2020-09-24 LAB — PREPARE RBC (CROSSMATCH)

## 2020-09-24 MED ORDER — FUROSEMIDE 10 MG/ML IJ SOLN
20.0000 mg | Freq: Once | INTRAMUSCULAR | Status: DC
  Filled 2020-09-24: qty 2

## 2020-09-24 MED ORDER — DIPHENHYDRAMINE HCL 50 MG/ML IJ SOLN
25.0000 mg | Freq: Once | INTRAMUSCULAR | Status: AC
  Administered 2020-09-24: 25 mg via INTRAVENOUS
  Filled 2020-09-24: qty 1

## 2020-09-24 MED ORDER — FUROSEMIDE 10 MG/ML IJ SOLN
20.0000 mg | Freq: Once | INTRAMUSCULAR | Status: AC
  Administered 2020-09-24: 20 mg via INTRAVENOUS
  Filled 2020-09-24: qty 2

## 2020-09-24 MED ORDER — LORATADINE 10 MG PO TABS
10.0000 mg | ORAL_TABLET | Freq: Every day | ORAL | Status: DC
  Administered 2020-09-24 – 2020-09-25 (×2): 10 mg via ORAL
  Filled 2020-09-24 (×2): qty 1

## 2020-09-24 MED ORDER — SODIUM CHLORIDE 0.9% IV SOLUTION
Freq: Once | INTRAVENOUS | Status: AC
  Administered 2020-09-24: 10 mL/h via INTRAVENOUS

## 2020-09-24 NOTE — Progress Notes (Signed)
1 Day Post-Op Procedure(s) (LRB): OPERATIVE LAPAROSCOPY WITH LYSIS OF ADHESIONS, TOTAL  ABDOMINAL HYSTERECTOMY, WITH SALPINGECTOMY (Bilateral) CYSTOSCOPY (N/A) EXPLORATORY LAPAROTOMY (N/A) OOPHORECTOMY (Left)  Subjective: Patient reports feeling lightheaded and nausea when she tried to stand up, could not stand up for long and desired to lie back in bed. She has tolerated clears diet but has not passed flatus yet. Her abdominal pain is well controlled.  She has had scant vaginal bleeding.   Objective: I have reviewed patient's vital signs and intake and output. Blood pressure 122/82, pulse 86, temperature 98.6 F (37 C), temperature source Oral, resp. rate 15, height 5\' 4"  (1.626 m), weight 82.6 kg, last menstrual period 07/18/2020, SpO2 95 %. Urine output: 2125/12 hrs. IVfluids: 1824/12 hrs.  General: cooperative, fatigued and no distress Resp: clear to auscultation bilaterally Cardio: regular rate and rhythm, S1, S2 normal, no murmur, click, rub or gallop GI: soft, non-tender; bowel sounds normal; no masses,  no organomegaly Extremities: extremities normal, atraumatic, no cyanosis or edema Vaginal Bleeding: minimal   CBC Latest Ref Rng & Units 09/24/2020 09/23/2020 09/21/2020  WBC 4.0 - 10.5 K/uL 8.2 12.9(H) 4.6  Hemoglobin 12.0 - 15.0 g/dL 7.3(L) 8.2(L) 11.8(L)  Hematocrit 36.0 - 46.0 % 23.3(L) 26.9(L) 39.0  Platelets 150 - 400 K/uL 241 194 319   BMP Latest Ref Rng & Units 09/24/2020 09/23/2020 09/21/2020  Glucose 70 - 99 mg/dL 108(H) 154(H) 102(H)  BUN 6 - 20 mg/dL 10 11 15   Creatinine 0.44 - 1.00 mg/dL 0.71 0.97 0.96  Sodium 135 - 145 mmol/L 132(L) 136 138  Potassium 3.5 - 5.1 mmol/L 3.8 3.6 4.1  Chloride 98 - 111 mmol/L 99 98 104  CO2 22 - 32 mmol/L 24 27 28   Calcium 8.9 - 10.3 mg/dL 8.5(L) 8.9 9.7    Assessment: POD # 1 S/P  OPERATIVE LAPAROSCOPY WITH LYSIS OF ADHESIONS, TOTAL  ABDOMINAL HYSTERECTOMY, WITH SALPINGECTOMY (Bilateral) CYSTOSCOPY (N/A) EXPLORATORY LAPAROTOMY  (N/A) OOPHORECTOMY (Left) - With symptomatic anemia. Management options discussed with recommendations of blood transfusion.  Patient verbalized understanding and agreement to proceed with the transfusion.  I discussed with patient risks, benefits and alternatives of blood transfusion including risks that include but not limited to of infection (through viruses, bacteria and parasites in donor blood), allergic, hemolytic and other immunologic transfusion reactions, volume overload, hyperkalemia in case of massive transfusion or impaired kidney function, iron overload in case of large numbers of transfusion.  She expressed understanding of all this and desires to proceed with the transfusion.   Plan: Advance diet to regular once passing flatus    Encouraged ambulation, plan to d/c foley once able to ambulate without difficulty  Advance to PO medication  2 Units PRBC transfusion and recheck H/H in 4 hours after last unit.   LOS: 1 day   Archie Endo, MD 09/24/2020, 10:00 AM.

## 2020-09-25 LAB — SURGICAL PATHOLOGY

## 2020-09-25 LAB — BPAM RBC
Blood Product Expiration Date: 202206072359
Blood Product Expiration Date: 202206082359
ISSUE DATE / TIME: 202205191126
ISSUE DATE / TIME: 202205191546
Unit Type and Rh: 6200
Unit Type and Rh: 6200

## 2020-09-25 LAB — TYPE AND SCREEN
ABO/RH(D): A POS
Antibody Screen: NEGATIVE
Unit division: 0
Unit division: 0

## 2020-09-25 LAB — HEMOGLOBIN AND HEMATOCRIT, BLOOD
HCT: 29.6 % — ABNORMAL LOW (ref 36.0–46.0)
Hemoglobin: 9.6 g/dL — ABNORMAL LOW (ref 12.0–15.0)

## 2020-09-25 MED ORDER — DOCUSATE SODIUM 100 MG PO CAPS: 100.0000 mg | ORAL_CAPSULE | Freq: Two times a day (BID) | ORAL | 0 refills | Status: DC

## 2020-09-25 MED ORDER — OXYCODONE HCL 5 MG PO TABS: 5.0000 mg | ORAL_TABLET | ORAL | 0 refills | Status: DC | PRN

## 2020-09-25 MED ORDER — IBUPROFEN 600 MG PO TABS: 600.0000 mg | ORAL_TABLET | Freq: Four times a day (QID) | ORAL | 0 refills | Status: AC

## 2020-09-25 MED ORDER — SIMETHICONE 80 MG PO CHEW: 80.0000 mg | CHEWABLE_TABLET | Freq: Four times a day (QID) | ORAL | 0 refills | Status: DC | PRN

## 2020-09-25 MED ORDER — FERROUS SULFATE 325 (65 FE) MG PO TABS: 325.0000 mg | ORAL_TABLET | Freq: Every day | ORAL | 3 refills | Status: AC

## 2020-09-25 NOTE — Progress Notes (Signed)
Pt alert and oriented. Tolerating diet, ambulating. D/C instructions given, pt d/cd to home.

## 2020-09-25 NOTE — Discharge Summary (Signed)
Physician Discharge Summary  Patient ID: Cassie Ward MRN: [id number] DOB/AGE: 52-May-1972 52 y.o.  Admit date: 09/23/2020 Discharge date: 09/25/2020  Admission Diagnoses: 1. Uterus with Leiomyoma.   2. Minimal uterine descensus.  3. Endometrial polyps.  4. Dysmenorrhea. 5. Menorrhagia. 6. Pelvic pain.  7. Failed conservative management of hormonal control, abdominal myomectomy and D & C hysteroscopy and now desiring definitive therapy via hysterectomy.    8. Desire to decrease future risk of ovarian and fallopian tube cancer.   9. Anemia.     Discharge Diagnoses:   Same as above and  10. Dense abdominal and pelvic adhesions.    Discharged Condition: good  Hospital Course: Patient presented for laparoscopic hysterectomy that had to be converted to an abdominal hysterectomy due to dense abdominal and pelvic adhesions, see op notes for more details. Post-operatively she had symptomatic anemia and received 2 units PRBC with improvement in her symptoms.  She tolerated a regular diet and passed flatus.  She was able to ambulate and void without difficulty.  She was deemed stable for discharge to home.  Final pathology came back benign.    Consults: None  Significant Diagnostic Studies: labs:    Treatments: Surgeries:  1. Attempted laparoscopic hysterectomy (Operative laparoscopy with extensive lysis of dense adhesions), converted to a total abdominal hysterectomy and bilateral salpingectomy, left oophorectomy, exploratory laparotomy.  2. Extensive lysis of dense adhesions.  2. Cystoscopy   Discharge Exam: Blood pressure 140/83, pulse 84, temperature 98.4 F (36.9 C), temperature source Oral, resp. rate 18, height 5\' 4"  (1.626 m), weight 82.6 kg, last menstrual period 07/18/2020, SpO2 96 %. General appearance: alert, cooperative and no distress Head: Normocephalic, without obvious abnormality, atraumatic Resp: clear to auscultation bilaterally Cardio: regular rate and rhythm, S1, S2  normal, no murmur, click, rub or gallop GI: soft, non-tender; bowel sounds normal; no masses,  no organomegaly Extremities: no edema, redness or tenderness in the calves or thighs Skin: Skin color, texture, turgor normal. No rashes or lesions or normal and no evidence of bleeding or bruising Incision/Wound: Clean, dry and intact.   LABS:  CBC Latest Ref Rng & Units 09/25/2020 09/24/2020 09/23/2020  WBC 4.0 - 10.5 K/uL - 8.2 12.9(H)  Hemoglobin 12.0 - 15.0 g/dL 9.6(L) 7.3(L) 8.2(L)  Hematocrit 36.0 - 46.0 % 29.6(L) 23.3(L) 26.9(L)  Platelets 150 - 400 K/uL - 241 194    BMP Latest Ref Rng & Units 09/24/2020 09/23/2020 09/21/2020  Glucose 70 - 99 mg/dL 108(H) 154(H) 102(H)  BUN 6 - 20 mg/dL 10 11 15   Creatinine 0.44 - 1.00 mg/dL 0.71 0.97 0.96  Sodium 135 - 145 mmol/L 132(L) 136 138  Potassium 3.5 - 5.1 mmol/L 3.8 3.6 4.1  Chloride 98 - 111 mmol/L 99 98 104  CO2 22 - 32 mmol/L 24 27 28   Calcium 8.9 - 10.3 mg/dL 8.5(L) 8.9 9.7   PATHOLOGY:  Component 2 d ago  SURGICAL PATHOLOGY SURGICAL PATHOLOGY  CASE: [id number]  PATIENT: Servando Salina  Surgical Pathology Report      Clinical History: Fibroids, ovarian cyst, pelvic pain (j[facility])      FINAL MICROSCOPIC DIAGNOSIS:   A. FATTY SUBCUTANEOUS TISSUE, EXCISION:  - Benign lymph node with prominent pigment  - No evidence of malignancy   B. FATTY SUBCUTANEOUS TISSUE, EXCISION:  - Benign lymph node with prominent pigment  - No evidence of malignancy   C. UTERUS, CERVIX, BILATERAL FALLOPIAN TUBES, LEFT OVARY:  - Uterus with serosal adhesions and leiomyomata, largest measuring 3.1  cm  -  Benign Brenner tumor of ovary, clinically left  - Benign endometrial polyp, 1.7 cm  - Benign inactive endometrium  - Adenomyosis  - Benign unremarkable cervix  - Benign unremarkable bilateral fallopian tubes  - No evidence of malignancy   D. UTERINE ADHESION, EXCISION:  - Benign fibroadipose tissue  - No evidence of malignancy      INTRAOPERATIVE DIAGNOSIS:   A1. FATTY SUBCUTANEOUS TISSUE, FROZEN SECTION:  Dense small blue cell infiltrate with pigment; cannot rule out  neoplastic process.  Defer to permanent sections.  Rapid intraoperative consult diagnosis rendered by Dr. Vic Ripper @ 1531  09/23/2020.    GROSS DESCRIPTION:   Specimen A: Received fresh for rapid intraoperative consult evaluation  by frozen section is a 0.9 x 0.5 x 0.4 cm fatty tissue with a 0.6 cm  area of dark-colored indurated nodularity. Submitted in one block for  frozen section.   Specimen B: Received fresh is a 1.1 x 1 x 0.7 cm aggregate of fatty  tissue fragments with areas of dark-colored indurated nodularity. A  portion of tissue with indurated nodularity is held in RPMI for possible  flow cytometry, with remaining specimen submitted in 1 block for routine  histology.   Specimen C: Received fresh is a uterus with attached cervix and portion  of left fallopian tube, and separate nodule. Clinically the specimen  includes bilateral tubes and left ovary.  Specimen integrity: Uterine body is intact.  Size and shape: Uterine body is distorted, 8.7 x 8.1 x 5.5 cm.  Weight: 236 g without adnexa  Serosa: Tan-pink to pink-red, diffuse adhesions and focal adherent fatty  tissue. There are few well-defined subserosal nodules which are up to  2.7 cm and have pink-white dense, whorled cut surfaces.  Cervix: 4.2 cm in length, 3.2 cm in diameter, has a pink-white smooth  ectocervix and trabeculated endocervix.  Endometrium: Endometrial cavity is 4.3 x 2.6 cm and has tan-pink to red  soft endometrium up to 0.2 cm thick. There is also a 1.7 x 0.9 x 0.2 cm  pink-red soft endometrial polyp.  Myometrium: Tan-pink to hyperemic, diffusely nodular, and distorted by  multiple well-defined intramural nodules which are up to 3.1 cm and have  pink-white dense, whorled cut surfaces.  Adnexa: The attached left fallopian tube is 5.3 cm in length,  up to 0.5  cm in diameter, has a dark red diffusely roughened serosa and  unremarkable cut surfaces. There is possible fimbria at the distal end.  Right fallopian tube is not identified, and no other tube segments are  identified within the container. The separate nodule is 1.5 cm in  diameter, well-defined, with a pink-purple smooth glistening surface,  and has a pale yellow-white solid, dense vaguely center with tan-pink  soft to rubbery tissue around the periphery. Definite ovarian  parenchyma is not identified.  Block Summary:  Block 1 = uterine serosa adhesions  Block 2 = cervix  Blocks 3, 4 = endomyometrium  Block 5 = endometrial polyp  Block 6 = subserosal and intramural nodules  Block 7 = sections of left fallopian tube, including entire distal end  Block 8 = sections of separate well-defined nodule   Specimen D: Received fresh is a 0.8 x 0.3 x 0.3 cm dark red soft  well-defined tissue/material which is bisected and submitted one block.   SW 09/24/2020         Disposition: Discharge disposition: 01-Home or [facility],  Ocean Schildt, MD. Go in 2 week(s).   Specialty: Obstetrics and Gynecology Contact information: [address] [place] [address] [phone]              An After Visit Summary was printed and given to the patient.  Allergies as of 09/25/2020      Reactions   Lisinopril Hives, Swelling   Swelling tongue/lips      Medication List    STOP taking these medications   hydrochlorothiazide 12.5 MG capsule Commonly known as: MICROZIDE     TAKE these medications   docusate sodium 100 MG capsule Commonly known as: COLACE Take 1 capsule (100 mg total) by mouth 2 (two) times daily.   ferrous sulfate 325 (65 FE) MG tablet Take 1 tablet (325 mg total) by mouth daily with breakfast. Start taking on: Sep 26, 2020   ibuprofen 600 MG tablet Commonly known as: ADVIL Take 1 tablet (600 mg total)  by mouth every 6 (six) hours.   multivitamin capsule Take 1 capsule by mouth daily.   oxyCODONE 5 MG immediate release tablet Commonly known as: Oxy IR/ROXICODONE Take 1-2 tablets (5-10 mg total) by mouth every 4 (four) hours as needed for moderate pain.   simethicone 80 MG chewable tablet Commonly known as: MYLICON Chew 1 tablet (80 mg total) by mouth 4 (four) times daily as needed for flatulence.   triamterene-hydrochlorothiazide 37.5-25 MG tablet Commonly known as: MAXZIDE-25 Take 1 tablet by mouth daily.        Discharge Instructions      Ms. [employer],  1. While at home remember to walk regularly, at least half to 1 hour a day, as this will help with your quick recovery. 2. Do not do any heavy lifting, i.e nothing heavier than 15 lbs for the next 6 weeks 3.  Do not use tampons or douche or take baths, do not have any sexual intercourse for the next 6 weeks.  4. Take your pain medication as needed for pain. 5. Get plenty of rest for the next two weeks to allow your body to recover.  You may feel tired in the process, this is normal. 6. Take your iron tablets daily for anemia.  You may also take a stool softener if you are constipated.    7.  You may get a fever while at home, if you do, check your temperature and if it is equal to or greater than 100.4 you may take tylenol.  If you continue with the fever after the tylenol please call the office.   8.  Please keep your upcoming appointment at the offices as scheduled.  9.You may shower as usual and pat incisions and dressing dry after showering. Remove honey comb dressing in 1 week after soaking it wet with water. I wish you a quick and safe recovery.  Dr. Melvyn Neth [facility] [phone] Assistant extension: [phone].     Signed: Archie Endo 09/25/2020, 11:57 AM

## 2020-09-25 NOTE — Discharge Instructions (Signed)
  Ms. Ambre Kobayashi,  1. While at home remember to walk regularly, at least half to 1 hour a day, as this will help with your quick recovery. 2. Do not do any heavy lifting, i.e nothing heavier than 15 lbs for the next 6 weeks 3.  Do not use tampons or douche or take baths, do not have any sexual intercourse for the next 6 weeks.  4. Take your pain medication as needed for pain. 5. Get plenty of rest for the next two weeks to allow your body to recover.  You may feel tired in the process, this is normal. 6. Take your iron tablets daily for anemia.  You may also take a stool softener if you are constipated.    7.  You may get a fever while at home, if you do, check your temperature and if it is equal to or greater than 100.4 you may take tylenol.  If you continue with the fever after the tylenol please call the office.   8.  Please keep your upcoming appointment at the offices as scheduled.  9.You may shower as usual and pat incisions and dressing dry after showering. Remove honey comb dressing in 1 week after soaking it wet with water. I wish you a quick and safe recovery.  Dr. Melvyn Neth [facility] [phone] Assistant extension: [phone].

## 2020-10-15 SURGERY — DILATION AND EVACUATION, UTERUS

## 2020-11-02 ENCOUNTER — Encounter (HOSPITAL_BASED_OUTPATIENT_CLINIC_OR_DEPARTMENT_OTHER): Payer: Self-pay | Admitting: Emergency Medicine

## 2020-11-02 ENCOUNTER — Other Ambulatory Visit (HOSPITAL_BASED_OUTPATIENT_CLINIC_OR_DEPARTMENT_OTHER): Payer: Self-pay

## 2020-11-02 ENCOUNTER — Emergency Department (HOSPITAL_BASED_OUTPATIENT_CLINIC_OR_DEPARTMENT_OTHER)
Admission: EM | Admit: 2020-11-02 | Discharge: 2020-11-02 | Disposition: A | Discharge status: Home / Self Care | Payer: BC Managed Care – PPO | Attending: Emergency Medicine | Admitting: Emergency Medicine

## 2020-11-02 ENCOUNTER — Other Ambulatory Visit: Payer: Self-pay

## 2020-11-02 DIAGNOSIS — Z8616 Personal history of COVID-19: Secondary | ICD-10-CM | POA: Diagnosis not present

## 2020-11-02 DIAGNOSIS — L509 Urticaria, unspecified: Secondary | ICD-10-CM | POA: Diagnosis not present

## 2020-11-02 DIAGNOSIS — I10 Essential (primary) hypertension: Secondary | ICD-10-CM | POA: Diagnosis not present

## 2020-11-02 MED ORDER — PREDNISONE 10 MG PO TABS
40.0000 mg | ORAL_TABLET | Freq: Every day | ORAL | 0 refills | Status: AC
  Filled 2020-11-02: qty 20, 5d supply, fill #0

## 2020-11-02 NOTE — Discharge Instructions (Addendum)
I am prescribing you a strong steroid medication called prednisone.  Please only take this as prescribed.  Please take it early in the morning, as this medication can be stimulating and make it difficult to sleep at night.  I would recommend continuing to take Benadryl as needed for itching as well as difficulty sleeping.  Work to find a new primary care provider in the area.  I would recommend calling your insurance company to find a list of primary care providers who accept your insurance.  If you develop any worsening swelling, shortness of breath, chest pain, vomiting, please come back to the emergency department immediately for reevaluation.  It was a pleasure to meet you.

## 2020-11-02 NOTE — ED Provider Notes (Signed)
[facility] EMERGENCY DEPARTMENT Provider Note   CSN: [id number] Arrival date & time: 11/02/20  1327     History Chief Complaint  Patient presents with   Facial Swelling    Cassie Ward is a 50 y.o. female.  HPI Patient is a 50 year old female with medical history as noted below.  She states that on May 18 of this year she had a total hysterectomy including a left oophorectomy.  She had a blood transfusion at that time.  She states about 2 weeks after she began developing intermittent hives, facial swelling.  She states that at times she feels mildly short of breath but denies any significant shortness of breath or wheezing.  No chest pain or tightness.  No nausea, vomiting, abdominal pain, or diarrhea.    Past Medical History:  Diagnosis Date   Anemia    Concussion 2018   no residual    COVID 07/2017   fever body aches loss of taste and smell x 2 weeks all symptoms resolved except ooc body aches   Dysrhythmia    irregular heart beats on occ    Fibroids 2010   Headache    Migraine   Hypertension    Wears contact lenses     Patient Active Problem List   Diagnosis Date Noted   Fibroid, uterine 04/28/2015   Fibroids 05/16/2012   Pelvic pain in female 05/16/2012    Past Surgical History:  Procedure Laterality Date   CYSTOSCOPY N/A 09/23/2020   Procedure: CYSTOSCOPY;  Surgeon: Waymon Amato, MD;  Location: [facility];  Service: Gynecology;  Laterality: N/A;   DILATATION & CURRETTAGE/HYSTEROSCOPY WITH RESECTOCOPE N/A 08/23/2012   Procedure: [place];  Surgeon: Ena Dawley, MD;  Location: [facility];  Service: Gynecology;  Laterality: N/A;  Hysteroscopy with Resection of Submucosal Fibroid; D&C - 60 minutes   LAPAROSCOPY N/A 09/23/2020   Procedure: LAPAROSCOPY OPERATIVE;  Surgeon: Waymon Amato, MD;  Location: [facility];  Service: Gynecology;  Laterality: N/A;   LAPAROTOMY N/A 09/23/2020    Procedure: EXPLORATORY LAPAROTOMY;  Surgeon: Waymon Amato, MD;  Location: [facility];  Service: Gynecology;  Laterality: N/A;   MYOMECTOMY N/A 04/28/2015   Procedure: ABDOMINAL MYOMECTOMY;  Surgeon: Ena Dawley, MD;  Location: [facility];  Service: Gynecology;  Laterality: N/A;   OOPHORECTOMY Left 09/23/2020   Procedure: OOPHORECTOMY;  Surgeon: Waymon Amato, MD;  Location: [facility];  Service: Gynecology;  Laterality: Left;   TONSILECTOMY/ADENOIDECTOMY WITH MYRINGOTOMY  tonsils age 16   TOTAL LAPAROSCOPIC HYSTERECTOMY WITH BILATERAL SALPINGO OOPHORECTOMY Bilateral 09/23/2020   Procedure: [id number] TOTAL  ABDOMINAL HYSTERECTOMY, TOTAL  ABDOMINAL HYSTERECTOMY WITH SALPINGECTOMY;  Surgeon: Waymon Amato, MD;  Location: [facility];  Service: Gynecology;  Laterality: Bilateral;   vaginal deliveries  1989     OB History     Gravida  2   Para  1   Term      Preterm      AB  1   Living         SAB      IAB      Ectopic      Multiple      Live Births              Family History  Problem Relation Age of Onset   Cancer Mother        Breast    Social History   Tobacco Use   Smoking  status: Never   Smokeless tobacco: Never  Vaping Use   Vaping Use: Never used  Substance Use Topics   Alcohol use: Yes    Comment: occ   Drug use: No    Home Medications Prior to Admission medications   Medication Sig Start Date End Date Taking? Authorizing Provider  predniSONE (DELTASONE) 10 MG tablet Take 4 tablets (40 mg total) by mouth daily for 5 days. 11/02/20 11/07/20 Yes Rayna Sexton, PA-C  docusate sodium (COLACE) 100 MG capsule Take 1 capsule (100 mg total) by mouth 2 (two) times daily. 09/25/20   Waymon Amato, MD  ferrous sulfate 325 (65 FE) MG tablet Take 1 tablet (325 mg total) by mouth daily with breakfast. 09/26/20 11/07/20  Waymon Amato, MD  ibuprofen (ADVIL) 600 MG tablet Take 1 tablet (600 mg total) by mouth every 6 (six)  hours. 09/25/20   Waymon Amato, MD  Multiple Vitamin (MULTIVITAMIN) capsule Take 1 capsule by mouth daily.    [provider]  oxyCODONE (OXY IR/ROXICODONE) 5 MG immediate release tablet Take 1-2 tablets (5-10 mg total) by mouth every 4 (four) hours as needed for moderate pain. 09/25/20   Waymon Amato, MD  simethicone (MYLICON) 80 MG chewable tablet Chew 1 tablet (80 mg total) by mouth 4 (four) times daily as needed for flatulence. 09/25/20   Waymon Amato, MD  triamterene-hydrochlorothiazide (MAXZIDE-25) 37.5-25 MG tablet Take 1 tablet by mouth daily. 09/21/20   [provider]    Allergies    Lisinopril  Review of Systems   Review of Systems  All other systems reviewed and are negative. Ten systems reviewed and are negative for acute change, except as noted in the HPI.   Physical Exam Updated Vital Signs BP (!) 186/102   Pulse 68   Temp 98.6 F (37 C) (Oral)   Resp 17   Ht 5\' 4"  (1.626 m)   Wt 79.8 kg   LMP 08/02/2020   SpO2 99%   BMI 30.21 kg/m   Physical Exam Vitals and nursing note reviewed.  Constitutional:      General: She is not in acute distress.    Appearance: Normal appearance. She is not ill-appearing, toxic-appearing or diaphoretic.  HENT:     Head: Normocephalic and atraumatic.     Comments: No facial swelling.    Right Ear: External ear normal.     Left Ear: External ear normal.     Nose: Nose normal.     Mouth/Throat:     Mouth: Mucous membranes are moist.     Pharynx: Oropharynx is clear. No oropharyngeal exudate or posterior oropharyngeal erythema.     Comments: No tongue swelling.  Uvula midline.  No drooling.  No hot potato voice. Eyes:     Extraocular Movements: Extraocular movements intact.  Cardiovascular:     Rate and Rhythm: Normal rate and regular rhythm.     Pulses: Normal pulses.     Heart sounds: Normal heart sounds. No murmur heard.   No friction rub. No gallop.  Pulmonary:     Effort: Pulmonary effort is normal. No respiratory  distress.     Breath sounds: Normal breath sounds. No stridor. No wheezing, rhonchi or rales.  Abdominal:     General: Abdomen is flat.     Tenderness: There is no abdominal tenderness.  Musculoskeletal:        General: Normal range of motion.     Cervical back: Normal range of motion and neck supple. No tenderness.  Skin:  General: Skin is warm and dry.     Findings: Rash present.     Comments: 2 hives noted to the left posterior thigh and neck.  Neurological:     General: No focal deficit present.     Mental Status: She is alert and oriented to person, place, and time.  Psychiatric:        Mood and Affect: Mood normal.        Behavior: Behavior normal.   ED Results / Procedures / Treatments   Labs (all labs ordered are listed, but only abnormal results are displayed) Labs Reviewed - No data to display  EKG None  Radiology No results found.  Procedures Procedures   Medications Ordered in ED Medications - No data to display  ED Course  I have reviewed the triage vital signs and the nursing notes.  Pertinent labs & imaging results that were available during my care of the patient were reviewed by me and considered in my medical decision making (see chart for details).    MDM Rules/Calculators/A&P                          Patient is a 50 year old female who presents to the emergency department with intermittent hives and facial swelling for the past month.  Symptoms started about 2 weeks after having a hysterectomy as well as a blood transfusion.  Patient currently has a hive to the back of her left thigh and the back of her neck.  No facial or intraoral swelling.  No other complaints at this time.  No chest pain, shortness of breath, chest tightness, nausea, or vomiting.  Doubt anaphylaxis.  Unsure of the source of her symptoms.  Given the chronicity doubt blood transfusion reaction.  She states that she has not used any new soaps, detergents, perfumes, or clothes.    She states that she has taken a course of prednisone that she was prescribed from an e-appointment and notes improvement in her symptoms when taking this.  She has also been using Benadryl intermittently.  Recommend she continue to take Benadryl as needed.  We will also prescribe an additional 5 days of prednisone.  She denies a history of diabetes mellitus.  Patient having issues getting an appointment with her PCP due to a billing issue.  She has health insurance.  Recommend that she get in touch with her insurance company and find a new PCP in the area.  Continue to follow-up with OB/GYN as needed.  We discussed signs and symptoms of anaphylaxis and she knows that she should come back to the emergency department if they should develop.  Feel that she is stable for discharge at this time and she is agreeable.  Her questions were answered and she was amicable at the time of discharge.  Final Clinical Impression(s) / ED Diagnoses Final diagnoses:  Hives   Rx / DC Orders ED Discharge Orders          Ordered    predniSONE (DELTASONE) 10 MG tablet  Daily        11/02/20 1527             Rayna Sexton, PA-C 11/02/20 1536    Sherwood Gambler, MD 11/03/20 [phone]

## 2020-11-02 NOTE — ED Triage Notes (Addendum)
Pt presents to ED POV. Pt c/o intermittent swelling of face, tongue, lips, hands, feet, and eyes and rash on back of thigh. Pt reports that she had hysterectomy and blood transfusion on 5/18 and s/s began a week afterwards. Pt prescribe benadryl and prednisone by PCP w/o relief

## 2020-11-02 NOTE — ED Notes (Signed)
Pt. Reports hysterectomy with leaving only one ovary on the R side.  Pt. Having anxiety and hot flashes.  Pt. Reports she was not placed on hormone therapy but has seen her OB today and reports same complaints she is in ED today with.   Pt. In no distress and has no swelling to the mouth tongue or face upon RN assessment.  Pt. Hands are WNL and lower extremities are WNL.  Pt. Is aware of reaction to blood transfusion being same day  And her OB explained same.

## 2020-11-05 ENCOUNTER — Ambulatory Visit (INDEPENDENT_AMBULATORY_CARE_PROVIDER_SITE_OTHER): Payer: BC Managed Care – PPO | Admitting: Neurology

## 2020-11-05 ENCOUNTER — Encounter: Payer: Self-pay | Admitting: Neurology

## 2020-11-05 ENCOUNTER — Other Ambulatory Visit: Payer: Self-pay

## 2020-11-05 VITALS — BP 179/101 | HR 65 | Ht 64.0 in | Wt 176.0 lb

## 2020-11-05 DIAGNOSIS — M25532 Pain in left wrist: Secondary | ICD-10-CM | POA: Diagnosis not present

## 2020-11-05 DIAGNOSIS — S6422XA Injury of radial nerve at wrist and hand level of left arm, initial encounter: Secondary | ICD-10-CM | POA: Diagnosis not present

## 2020-11-05 DIAGNOSIS — S5422XA Injury of radial nerve at forearm level, left arm, initial encounter: Secondary | ICD-10-CM

## 2020-11-05 DIAGNOSIS — M79632 Pain in left forearm: Secondary | ICD-10-CM

## 2020-11-05 NOTE — Patient Instructions (Addendum)
Radial nerve distribution Xray of the wrist and distal forearm to ensure no bony abnormalities that could be affecting the radial nerve.

## 2020-11-05 NOTE — Progress Notes (Signed)
[facility]    Provider:  Dr Jaynee Eagles Requesting Provider: Waymon Amato, MD Primary Care Provider:  Karlton Lemon, PA-C  CC:  tingline LUE  HPI:  Cassie Ward is a 50 y.o. female here as requested by Waymon Amato, MD forleft upper extremity tingling after IV.  Past medical history uterine leiomyoma, anemia, vaginitis, fatigue.  I reviewed Dr. Fonnie Jarvis notes: Patient is complaining of left arm/hand tingling numbness after getting an IV in the hospital, has tried gabapentin 300 mg with no relief, currently wearing a hand/arm brace; numbness and tingling in the left wrist area where the IV site was before.  I reviewed physical exam which was normal including cardiovascular, left and right hands and arms were normal appearance, no erythema, no warmth, intact strength and sensation on the bilateral sides.  They advised a heat pad/ice pack use over the left breast if there is continued symptoms consider physical therapy.  May 18th she had an IV in the top of the hand in a medical facility, left, dorsum of around 1-2 digits and top of hand (points to the raidal distribution) numb after that, initially it was shooting and shocking and moderately painful, sharp pains, slightly better, waaxing and waning but all day. She started noticing it after the IV, she felt it at home and then realized that was where the IV was. Nothing makes it better or worse. No other focal neurologic deficits, associated symptoms, inciting events or modifiable factors. Reviewed notes, labs and imaging from outside physicians, which showed see above 09/23/2020: cbc with anemia, bmp with elevated glucose  Review of Systems: Patient complains of symptoms per HPI as well as the following symptoms tingling and numbness. Pertinent negatives and positives per HPI. All others negative.   Social History   Socioeconomic History   Marital status: Married    Spouse name: Not on file   Number of children: Not on file    Years of education: Not on file   Highest education level: Not on file  Occupational History   Not on file  Tobacco Use   Smoking status: Never   Smokeless tobacco: Never  Vaping Use   Vaping Use: Never used  Substance and Sexual Activity   Alcohol use: Yes    Comment: occ   Drug use: No   Sexual activity: Yes    Birth control/protection: Condom  Other Topics Concern   Not on file  Social History Narrative   Caffeine: 8 oz coffee in the morning    Right handed   Lives at home with spouse   Social Determinants of Health   Financial Resource Strain: Not on file  Food Insecurity: Not on file  Transportation Needs: Not on file  Physical Activity: Not on file  Stress: Not on file  Social Connections: Not on file  Intimate Partner Violence: Not on file    Family History  Problem Relation Age of Onset   Cancer Mother        Breast   Neuropathy Neg Hx     Past Medical History:  Diagnosis Date   Anemia    Concussion 2018   no residual    COVID 07/2017   fever body aches loss of taste and smell x 2 weeks all symptoms resolved except ooc body aches   Dysrhythmia    irregular heart beats on occ    Fibroids 2010   Headache    Migraine   Hypertension    Wears contact lenses  Patient Active Problem List   Diagnosis Date Noted   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   Fibroid, uterine 04/28/2015   Fibroids 05/16/2012   Pelvic pain in female 05/16/2012    Past Surgical History:  Procedure Laterality Date   CYSTOSCOPY N/A 09/23/2020   Procedure: CYSTOSCOPY;  Surgeon: Waymon Amato, MD;  Location: [facility];  Service: Gynecology;  Laterality: N/A;   DILATATION & CURRETTAGE/HYSTEROSCOPY WITH RESECTOCOPE N/A 08/23/2012   Procedure: [place];  Surgeon: Ena Dawley, MD;  Location: [facility];  Service: Gynecology;  Laterality: N/A;  Hysteroscopy with Resection of Submucosal Fibroid; D&C - 60  minutes   LAPAROSCOPY N/A 09/23/2020   Procedure: LAPAROSCOPY OPERATIVE;  Surgeon: Waymon Amato, MD;  Location: [facility];  Service: Gynecology;  Laterality: N/A;   LAPAROTOMY N/A 09/23/2020   Procedure: EXPLORATORY LAPAROTOMY;  Surgeon: Waymon Amato, MD;  Location: [facility];  Service: Gynecology;  Laterality: N/A;   MYOMECTOMY N/A 04/28/2015   Procedure: ABDOMINAL MYOMECTOMY;  Surgeon: Ena Dawley, MD;  Location: [facility];  Service: Gynecology;  Laterality: N/A;   OOPHORECTOMY Left 09/23/2020   Procedure: OOPHORECTOMY;  Surgeon: Waymon Amato, MD;  Location: [facility];  Service: Gynecology;  Laterality: Left;   TONSILECTOMY/ADENOIDECTOMY WITH MYRINGOTOMY  tonsils age 76   TOTAL LAPAROSCOPIC HYSTERECTOMY WITH BILATERAL SALPINGO OOPHORECTOMY Bilateral 09/23/2020   Procedure: [id number] TOTAL  ABDOMINAL HYSTERECTOMY, TOTAL  ABDOMINAL HYSTERECTOMY WITH SALPINGECTOMY;  Surgeon: Waymon Amato, MD;  Location: [facility];  Service: Gynecology;  Laterality: Bilateral;   vaginal deliveries  1989    Current Outpatient Medications  Medication Sig Dispense Refill   ferrous sulfate 325 (65 FE) MG tablet Take 1 tablet (325 mg total) by mouth daily with breakfast. 42 tablet 3   ibuprofen (ADVIL) 600 MG tablet Take 1 tablet (600 mg total) by mouth every 6 (six) hours. 30 tablet 0   Multiple Vitamin (MULTIVITAMIN) capsule Take 1 capsule by mouth daily.     No current facility-administered medications for this visit.    Allergies as of 11/05/2020 - Review Complete 11/05/2020  Allergen Reaction Noted   Lisinopril Hives and Swelling 09/23/2020    Vitals: BP (!) 179/101 (BP Location: Right Arm, Patient Position: Sitting) Comment: anxious  Pulse 65   Ht 5\' 4"  (1.626 m)   Wt 176 lb (79.8 kg)   LMP 08/02/2020   BMI 30.21 kg/m  Last Weight:  Wt Readings from Last 1 Encounters:  11/05/20 176 lb (79.8 kg)   Last Height:   Ht Readings  from Last 1 Encounters:  11/05/20 5\' 4"  (1.626 m)     Physical exam: Exam: Gen: NAD, conversant, well nourised, obese, well groomed                     CV: RRR, no MRG. No Carotid Bruits. No peripheral edema, warm, nontender Eyes: Conjunctivae clear without exudates or hemorrhage  Neuro: Detailed Neurologic Exam  Speech:    Speech is normal; fluent and spontaneous with normal comprehension.  Cognition:    The patient is oriented to person, place, and time;     recent and remote memory intact;     language fluent;     normal attention, concentration,     fund of knowledge Cranial Nerves:    The pupils are equal, round, and reactive to light. The fundi are flat. Visual fields are full to finger confrontation. Extraocular movements are  intact. Trigeminal sensation is intact and the muscles of mastication are normal. The face is symmetric. The palate elevates in the midline. Hearing intact. Voice is normal. Shoulder shrug is normal. The tongue has normal motion without fasciculations.   Coordination:    Normal finger to nose    Gait: normal.   Motor Observation:    No asymmetry, no atrophy, and no involuntary movements noted. Tone:    Normal muscle tone.    Posture:    Posture is normal. normal erect    Strength:    Strength is V/V in the upper and lower limbs including radial distribution     Sensation: decreased left in a superficial distal radial nerve distribution of the dorsum of the hand and distal forearm     Reflex Exam:  DTR's:    Deep tendon reflexes in the upper and lower extremities are normal bilaterally.   Toes:    The toes are downgoing bilaterally.   Clonus:    Clonus is absent.    Assessment/Plan: patient with numbness and paresthesias after IV placement,  IV may have impacted  the left superficial sensory branch of radial nerve but difficult to say as she noticed it when she got home, could have been another mechanism as well such as other injury or  trauma. No weakness apreciated on exam  Xray of the wrist and distal forearm to ensure no bony abnormalities that could be affecting the superficial sensory nerve. or may consider seeing orthopaedics if this is negative for any other possible causes of the dorsal wrist pain and numbness/tingling   Offered an emg/ncs, she is improving so will hold off, if continues can purse however if it was trauma or IV then may just need to wait for recovery. In the meantime will order xrays to eval for any bony or other abnormalities impacting the radial nerve.  Orders Placed This Encounter  Procedures   DG Wrist Complete Left   DG Forearm Left    No orders of the defined types were placed in this encounter.   Cc: Waymon Amato, MD,  Karlton Lemon, PA-C  Sarina Ill, MD  [facility] [address] [place], [place] [address]  Phone [phone] Fax [phone]

## 2020-11-10 ENCOUNTER — Encounter: Payer: Self-pay | Admitting: Neurology

## 2020-11-10 DIAGNOSIS — S6422XA Injury of radial nerve at wrist and hand level of left arm, initial encounter: Secondary | ICD-10-CM | POA: Insufficient documentation

## 2020-11-23 ENCOUNTER — Ambulatory Visit
Admission: RE | Admit: 2020-11-23 | Discharge: 2020-11-23 | Disposition: A | Discharge status: Home / Self Care | Payer: BC Managed Care – PPO | Source: Ambulatory Visit | Attending: Neurology | Admitting: Neurology

## 2020-11-23 DIAGNOSIS — M25532 Pain in left wrist: Secondary | ICD-10-CM

## 2020-11-23 DIAGNOSIS — S6422XA Injury of radial nerve at wrist and hand level of left arm, initial encounter: Secondary | ICD-10-CM

## 2020-11-23 DIAGNOSIS — M79632 Pain in left forearm: Secondary | ICD-10-CM

## 2021-04-13 ENCOUNTER — Other Ambulatory Visit: Payer: Self-pay

## 2021-04-13 ENCOUNTER — Encounter (HOSPITAL_BASED_OUTPATIENT_CLINIC_OR_DEPARTMENT_OTHER): Payer: Self-pay | Admitting: Emergency Medicine

## 2021-04-13 ENCOUNTER — Emergency Department (HOSPITAL_BASED_OUTPATIENT_CLINIC_OR_DEPARTMENT_OTHER): Payer: BC Managed Care – PPO

## 2021-04-13 ENCOUNTER — Emergency Department (HOSPITAL_BASED_OUTPATIENT_CLINIC_OR_DEPARTMENT_OTHER)
Admission: EM | Admit: 2021-04-13 | Discharge: 2021-04-13 | Disposition: A | Discharge status: Home / Self Care | Payer: BC Managed Care – PPO | Attending: Emergency Medicine | Admitting: Emergency Medicine

## 2021-04-13 DIAGNOSIS — H9203 Otalgia, bilateral: Secondary | ICD-10-CM | POA: Insufficient documentation

## 2021-04-13 DIAGNOSIS — I10 Essential (primary) hypertension: Secondary | ICD-10-CM | POA: Insufficient documentation

## 2021-04-13 DIAGNOSIS — I160 Hypertensive urgency: Secondary | ICD-10-CM | POA: Diagnosis not present

## 2021-04-13 DIAGNOSIS — Z8616 Personal history of COVID-19: Secondary | ICD-10-CM | POA: Diagnosis not present

## 2021-04-13 DIAGNOSIS — R519 Headache, unspecified: Secondary | ICD-10-CM | POA: Diagnosis not present

## 2021-04-13 LAB — URINALYSIS, ROUTINE W REFLEX MICROSCOPIC
Bilirubin Urine: NEGATIVE
Glucose, UA: NEGATIVE mg/dL
Hgb urine dipstick: NEGATIVE
Ketones, ur: NEGATIVE mg/dL
Leukocytes,Ua: NEGATIVE
Nitrite: NEGATIVE
Protein, ur: NEGATIVE mg/dL
Specific Gravity, Urine: 1.015 (ref 1.005–1.030)
pH: 7 (ref 5.0–8.0)

## 2021-04-13 LAB — CBC WITH DIFFERENTIAL/PLATELET
Abs Immature Granulocytes: 0.02 10*3/uL (ref 0.00–0.07)
Basophils Absolute: 0 10*3/uL (ref 0.0–0.1)
Basophils Relative: 1 %
Eosinophils Absolute: 0.2 10*3/uL (ref 0.0–0.5)
Eosinophils Relative: 3 %
HCT: 38.2 % (ref 36.0–46.0)
Hemoglobin: 12.5 g/dL (ref 12.0–15.0)
Immature Granulocytes: 0 %
Lymphocytes Relative: 42 %
Lymphs Abs: 2.3 10*3/uL (ref 0.7–4.0)
MCH: 26.8 pg (ref 26.0–34.0)
MCHC: 32.7 g/dL (ref 30.0–36.0)
MCV: 81.8 fL (ref 80.0–100.0)
Monocytes Absolute: 0.4 10*3/uL (ref 0.1–1.0)
Monocytes Relative: 7 %
Neutro Abs: 2.6 10*3/uL (ref 1.7–7.7)
Neutrophils Relative %: 47 %
Platelets: 309 10*3/uL (ref 150–400)
RBC: 4.67 MIL/uL (ref 3.87–5.11)
RDW: 14.3 % (ref 11.5–15.5)
WBC: 5.4 10*3/uL (ref 4.0–10.5)
nRBC: 0 % (ref 0.0–0.2)

## 2021-04-13 LAB — COMPREHENSIVE METABOLIC PANEL
ALT: 14 U/L (ref 0–44)
AST: 17 U/L (ref 15–41)
Albumin: 4.3 g/dL (ref 3.5–5.0)
Alkaline Phosphatase: 64 U/L (ref 38–126)
Anion gap: 8 (ref 5–15)
BUN: 15 mg/dL (ref 6–20)
CO2: 27 mmol/L (ref 22–32)
Calcium: 9.6 mg/dL (ref 8.9–10.3)
Chloride: 102 mmol/L (ref 98–111)
Creatinine, Ser: 0.92 mg/dL (ref 0.44–1.00)
GFR, Estimated: >60 mL/min (ref >60)
Glucose, Bld: 92 mg/dL (ref 70–99)
Potassium: 3.3 mmol/L — ABNORMAL LOW (ref 3.5–5.1)
Sodium: 137 mmol/L (ref 135–145)
Total Bilirubin: 0.2 mg/dL — ABNORMAL LOW (ref 0.3–1.2)
Total Protein: 8 g/dL (ref 6.5–8.1)

## 2021-04-13 LAB — TROPONIN I (HIGH SENSITIVITY): Troponin I (High Sensitivity): <2 ng/L (ref <18)

## 2021-04-13 LAB — HCG, SERUM, QUALITATIVE: Preg, Serum: NEGATIVE

## 2021-04-13 MED ORDER — LABETALOL HCL 5 MG/ML IV SOLN
10.0000 mg | Freq: Once | INTRAVENOUS | Status: AC
  Administered 2021-04-13: 10 mg via INTRAVENOUS
  Filled 2021-04-13: qty 4

## 2021-04-13 MED ORDER — LACTATED RINGERS IV BOLUS
500.0000 mL | Freq: Once | INTRAVENOUS | Status: AC
  Administered 2021-04-13: 500 mL via INTRAVENOUS

## 2021-04-13 NOTE — ED Triage Notes (Addendum)
Pt seen at ENT today for BL ear pain that has been going on x 2 months. ENT sent pt here due to elevated BP. Pt also c/o headache x 1 day and dizziness from vertigo since July. Pt was on BP meds but stopped taking them x 1 month ago because she states "my blood pressure was consistently normal" Also, pt states she hit her head getting into her car a couple weeks ago. Denies weakness, changes in vision, n/v, fevers, cp, shob.

## 2021-04-13 NOTE — Discharge Instructions (Signed)
You are seen in the ER today due to your blood pressure.  Your physical exam, vital signs, blood work and CT scan were very reassuring.  You should begin taking your high blood pressure medication again immediately.  You should take this daily as prescribed.  Follow-up with your primary care doctor and return to the ER with any worsening headache, blurred or double vision, nausea or vomiting that does not stop, chest pain, shortness of breath, or any other new serious symptoms.

## 2021-04-13 NOTE — ED Notes (Signed)
Discharge instructions discussed with pt. Pt verbalized understanding with no questions at this time. Pt to go home with s/o at bedside 

## 2021-04-13 NOTE — ED Provider Notes (Signed)
[facility] EMERGENCY DEPARTMENT Provider Note   CSN: [id number] Arrival date & time: 04/13/21  1646     History Chief Complaint  Patient presents with   Otalgia   Headache   Hypertension   Dizziness    Cassie Ward is a 50 y.o. female who presents on recommendation from ENT office.  Patient was following with ENT for bilateral ear pain and ongoing vertigo symptoms since July of this year.  On intake she was found to have BP with 200s /120 and was directed to the emergency department for further evaluation.  She does endorse worsening headache today.  No new blurry vision though she does have blurry vision when she is not wearing her glasses.  No double vision.  Denies any chest pain, shortness of breath, palpitations, or change in her urinary habits.  She states that she used to be on blood pressure medication with amlodipine and 1 other medication that starts with an L which she cannot remember but she stopped taking them approximately 1 month ago because "my blood pressure was normal on my medicine".  States that she has history of hives and angioedema secondary to lisinopril.  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   Fibroid, uterine 04/28/2015   Fibroids 05/16/2012   Pelvic pain in female 05/16/2012    Past Surgical History:  Procedure Laterality Date   CYSTOSCOPY N/A 09/23/2020   Procedure: CYSTOSCOPY;  Surgeon: Waymon Amato, MD;  Location: [facility];  Service: Gynecology;  Laterality: N/A;   DILATATION & CURRETTAGE/HYSTEROSCOPY WITH  RESECTOCOPE N/A 08/23/2012   Procedure: [place];  Surgeon: Ena Dawley, MD;  Location: [facility];  Service: Gynecology;  Laterality: N/A;  Hysteroscopy with Resection of Submucosal Fibroid; D&C - 60 minutes   LAPAROSCOPY N/A 09/23/2020   Procedure: LAPAROSCOPY OPERATIVE;  Surgeon: Waymon Amato, MD;  Location: [facility];  Service: Gynecology;  Laterality: N/A;   LAPAROTOMY N/A 09/23/2020   Procedure: EXPLORATORY LAPAROTOMY;  Surgeon: Waymon Amato, MD;  Location: [facility];  Service: Gynecology;  Laterality: N/A;   MYOMECTOMY N/A 04/28/2015   Procedure: ABDOMINAL MYOMECTOMY;  Surgeon: Ena Dawley, MD;  Location: [facility];  Service: Gynecology;  Laterality: N/A;   OOPHORECTOMY Left 09/23/2020   Procedure: OOPHORECTOMY;  Surgeon: Waymon Amato, MD;  Location: [facility];  Service: Gynecology;  Laterality: Left;   TONSILECTOMY/ADENOIDECTOMY WITH MYRINGOTOMY  tonsils age 44   TOTAL LAPAROSCOPIC HYSTERECTOMY WITH BILATERAL SALPINGO OOPHORECTOMY Bilateral 09/23/2020   Procedure: [id number] TOTAL  ABDOMINAL HYSTERECTOMY, TOTAL  ABDOMINAL HYSTERECTOMY WITH SALPINGECTOMY;  Surgeon: Waymon Amato, MD;  Location: [facility];  Service: Gynecology;  Laterality: Bilateral;   vaginal deliveries  1989     OB History     Gravida  2   Para  1   Term      Preterm      AB  1  Living         SAB      IAB      Ectopic      Multiple      Live Births              Family History  Problem Relation Age of Onset   Cancer Mother        Breast   Neuropathy Neg Hx     Social History   Tobacco Use   Smoking status: Never   Smokeless tobacco: Never  Vaping Use   Vaping Use: Never used  Substance Use Topics   Alcohol use: Yes    Comment: occ   Drug use: No    Home Medications Prior to Admission medications   Medication Sig Start Date End Date Taking? Authorizing Provider   ferrous sulfate 325 (65 FE) MG tablet Take 1 tablet (325 mg total) by mouth daily with breakfast. 09/26/20 11/07/20  Waymon Amato, MD  ibuprofen (ADVIL) 600 MG tablet Take 1 tablet (600 mg total) by mouth every 6 (six) hours. 09/25/20   Waymon Amato, MD  Multiple Vitamin (MULTIVITAMIN) capsule Take 1 capsule by mouth daily.    [provider]    Allergies    Lisinopril  Review of Systems   Review of Systems  Constitutional: Negative.   HENT:  Positive for ear pain. Negative for facial swelling, hearing loss, mouth sores, sore throat, trouble swallowing and voice change.   Eyes: Negative.   Respiratory: Negative.    Cardiovascular: Negative.   Gastrointestinal: Negative.   Genitourinary: Negative.   Musculoskeletal: Negative.   Skin: Negative.   Neurological:  Positive for headaches. Negative for light-headedness.   Physical Exam Updated Vital Signs BP (!) 169/108   Pulse 62   Temp 97.8 F (36.6 C)   Resp 15   LMP 08/02/2020   SpO2 98%   Physical Exam Vitals and nursing note reviewed.  Constitutional:      Appearance: She is not ill-appearing or toxic-appearing.  HENT:     Head: Normocephalic and atraumatic.     Right Ear: Tympanic membrane normal.     Left Ear: Tympanic membrane normal.     Nose: Nose normal.     Mouth/Throat:     Mouth: Mucous membranes are moist.     Pharynx: Oropharynx is clear. Uvula midline. No oropharyngeal exudate or posterior oropharyngeal erythema.     Tonsils: No tonsillar exudate.  Eyes:     General: Lids are normal. Vision grossly intact.        Right eye: No discharge.        Left eye: No discharge.     Conjunctiva/sclera: Conjunctivae normal.     Pupils: Pupils are equal, round, and reactive to light.  Neck:     Trachea: Trachea and phonation normal.  Cardiovascular:     Rate and Rhythm: Normal rate and regular rhythm.     Pulses: Normal pulses.     Heart sounds: Normal heart sounds. No murmur heard. Pulmonary:     Effort:  Pulmonary effort is normal. No tachypnea, bradypnea, accessory muscle usage, prolonged expiration or respiratory distress.     Breath sounds: Normal breath sounds. No wheezing or rales.  Chest:     Chest wall: No mass, lacerations, deformity, swelling, tenderness, crepitus or edema.  Abdominal:     General: Bowel sounds are normal. There is no distension.     Palpations: Abdomen is soft.     Tenderness: There  is no abdominal tenderness. There is no right CVA tenderness, left CVA tenderness, guarding or rebound.  Musculoskeletal:        General: No deformity.     Cervical back: Normal range of motion and neck supple. No edema, rigidity or crepitus. No pain with movement, spinous process tenderness or muscular tenderness.     Right lower leg: No edema.     Left lower leg: No edema.  Lymphadenopathy:     Cervical: No cervical adenopathy.  Skin:    General: Skin is warm and dry.     Capillary Refill: Capillary refill takes less than 2 seconds.     Findings: No rash.  Neurological:     Mental Status: She is alert. Mental status is at baseline.     GCS: GCS eye subscore is 4. GCS verbal subscore is 5. GCS motor subscore is 6.     Cranial Nerves: Cranial nerves 2-12 are intact.     Sensory: Sensation is intact.     Motor: Motor function is intact.     Coordination: Coordination is intact.     Gait: Gait is intact. Gait normal.  Psychiatric:        Mood and Affect: Mood normal.    ED Results / Procedures / Treatments   Labs (all labs ordered are listed, but only abnormal results are displayed) Labs Reviewed  COMPREHENSIVE METABOLIC PANEL - Abnormal; Notable for the following components:      Result Value   Potassium 3.3 (*)    Total Bilirubin 0.2 (*)    All other components within normal limits  URINALYSIS, ROUTINE W REFLEX MICROSCOPIC - Abnormal; Notable for the following components:   Color, Urine STRAW (*)    All other components within normal limits  CBC WITH  DIFFERENTIAL/PLATELET  HCG, SERUM, QUALITATIVE  TROPONIN I (HIGH SENSITIVITY)  TROPONIN I (HIGH SENSITIVITY)    EKG EKG Interpretation  Date/Time:  Tuesday April 13 2021 17:48:30 EST Ventricular Rate:  62 PR Interval:  145 QRS Duration: 95 QT Interval:  389 QTC Calculation: 395 R Axis:   9 Text Interpretation: Sinus rhythm Borderline T abnormalities, diffuse leads Similar to previous Confirmed by Lavenia Atlas [phone]) on 04/13/2021 7:40:42 PM  Radiology CT Head Wo Contrast  Result Date: 04/13/2021 CLINICAL DATA:  Headache, new or worsening (Age >= 50y) Hypertensive urgency EXAM: CT HEAD WITHOUT CONTRAST TECHNIQUE: Contiguous axial images were obtained from the base of the skull through the vertex without intravenous contrast. COMPARISON:  CT head June 29, 2014. FINDINGS: Brain: No evidence of acute vascular territory infarction, hemorrhage, hydrocephalus, extra-axial collection or mass lesion/mass effect. Vascular: No hyperdense vessel identified. Skull: No acute fracture. Sinuses/Orbits: Visualized sinuses are clear. Unremarkable visualized orbits. Other: No mastoid effusions. IMPRESSION: No evidence of acute intracranial abnormality. Electronically Signed   By: Margaretha Sheffield M.D.   On: 04/13/2021 18:57    Procedures Procedures   Medications Ordered in ED Medications  labetalol (NORMODYNE) injection 10 mg (10 mg Intravenous Given 04/13/21 1839)  lactated ringers bolus 500 mL (0 mLs Intravenous Stopped 04/13/21 1928)    ED Course  I have reviewed the triage vital signs and the nursing notes.  Pertinent labs & imaging results that were available during my care of the patient were reviewed by me and considered in my medical decision making (see chart for details).    MDM Rules/Calculators/A&P  50 year old female who presents with concern for high blood pressure as detected at her ENT appointment this morning.  Differential diagnosis includes but  limited to hypertension, hypertensive emergency, hypertensive emergency, ACS.  Hypertensive to 186/116 on intake, vital signs otherwise normal.  Cardiopulmonary exam is normal, abdominal exam is benign.  Patient complains of ear pain, TMs are normal bilaterally.  She is neurovascularly intact without focal deficit on neurologic exam.  .  Patient is not pregnant and UA is unremarkable.  EKG was obtained during work-up for possible hypertensive emergency which had sinus rhythm without any ischemic changes.  CT of the head without contrast additionally was negative for acute intracranial abnormality.  Patient was administered 10 mg of IV labetalol with improvement in her systolic blood pressure to 702; diastolic remains elevated at 109.  Given reassuring work-up and asymptomatic patient at this time with improvement in her headache, no further work-up is warranted in the ER at this time.  Patient was instructed to resume taking her antihypertensive medication as previously prescribed for at home and to follow-up closely with her PCP.  Instructions for blood pressure journal given.  [place] voiced understanding of her medical evaluation and treatment plan.  To her questions answered to her expressed satisfaction.  Return precautions were given.  Patient is well-appearing, stable, and appropriate for discharge at this time.  This chart was dictated using voice recognition software, Dragon. Despite the best efforts of this provider to proofread and correct errors, errors may still occur which can change documentation meaning.    Final Clinical Impression(s) / ED Diagnoses Final diagnoses:  Hypertensive urgency    Rx / DC Orders ED Discharge Orders     None        Emeline Darling, PA-C 04/15/21 0902    Lorelle Gibbs, DO 04/15/21 2343

## 2022-01-05 IMAGING — CT CT HEAD W/O CM
4 of 8 series · 16 of 47 positions shown, 17 images · non-contrast
Comparison: CT head June 29, 2014.

CLINICAL DATA: Headache, new or worsening (Age >= 50y) Hypertensive
urgency

EXAM:
CT HEAD WITHOUT CONTRAST
TECHNIQUE: Contiguous axial images were obtained from the base of the skull
through the vertex without intravenous contrast.

[Series 4: coronal soft · coronal · 0.31mm/px · 3 of 75 slices shown]
[im 19/75  brain]
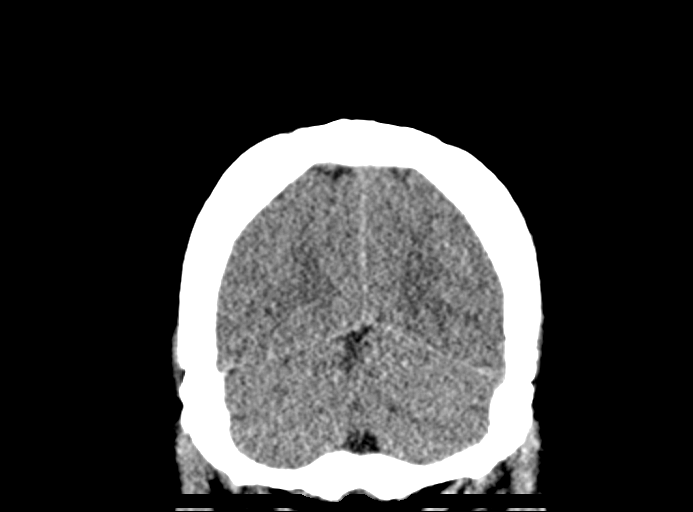
[im 38/75  brain]
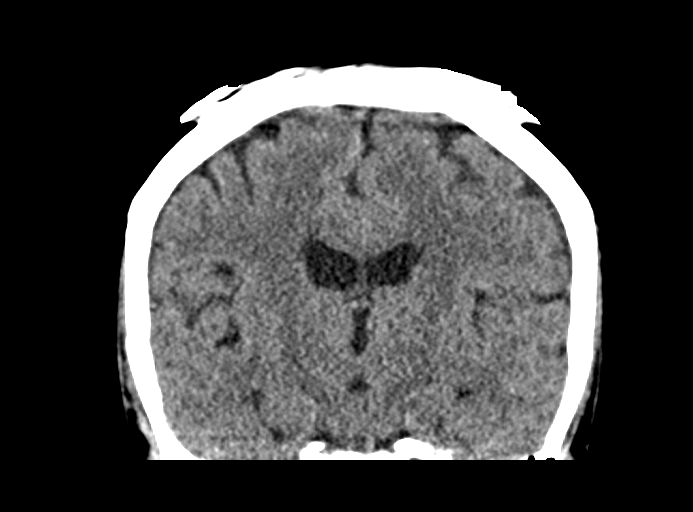
[im 56/75  brain]
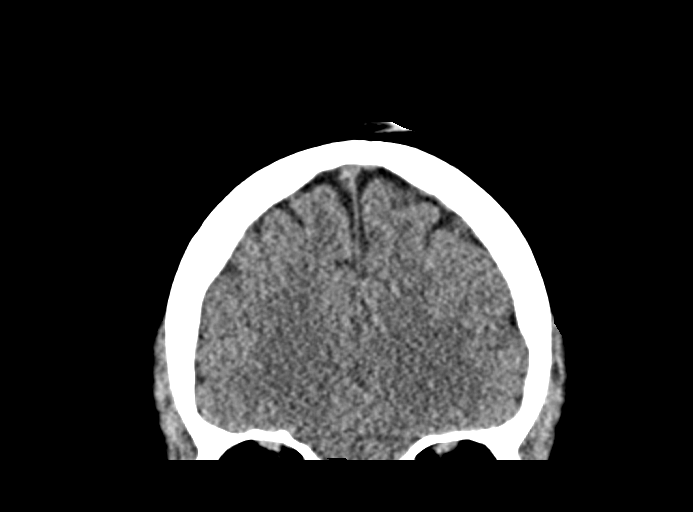

[Series 5: sag soft · sagittal · 0.31mm/px · 2 of 67 slices shown]
[im 23/67  brain]
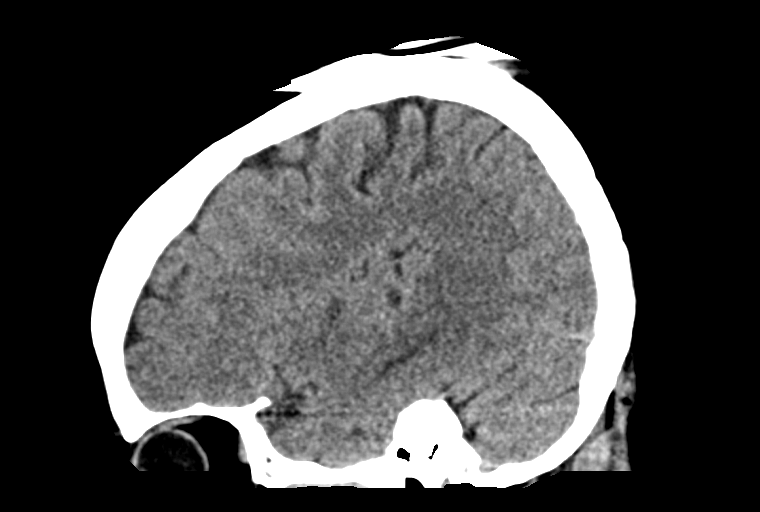
[im 45/67  brain]
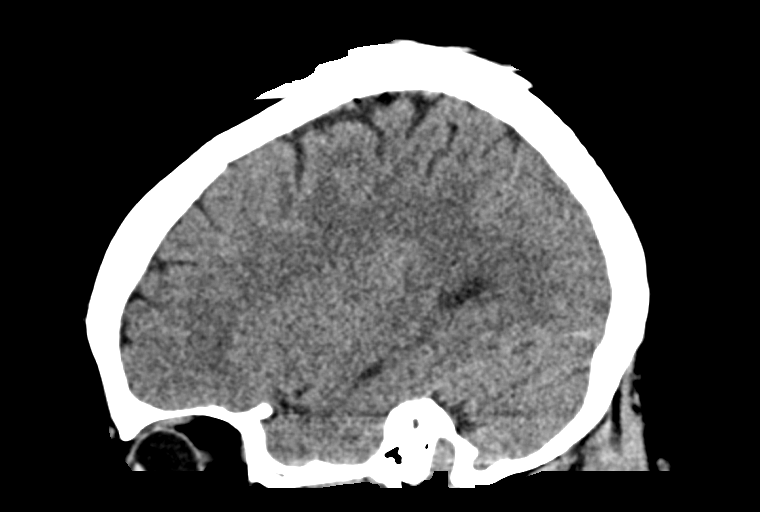

[Series 6: head wo · axial · 0.46mm/px · z∈[-90,-45]mm · 3 of 10 slices shown, 4 images]
[im 1/10  brain]
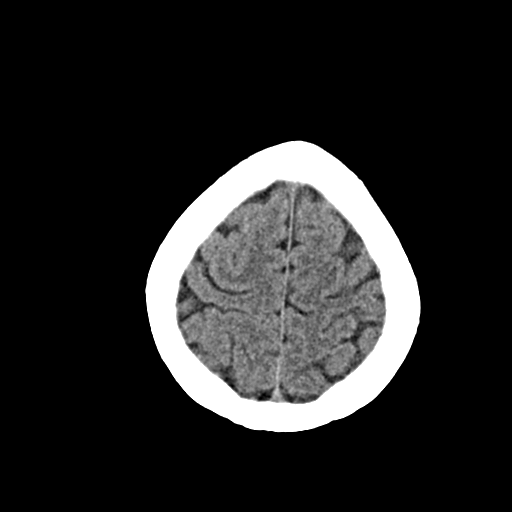
[im 1/10  bone]
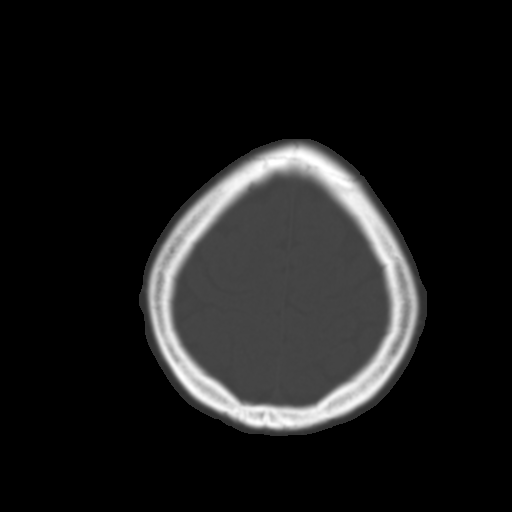
[im 5/10  brain]
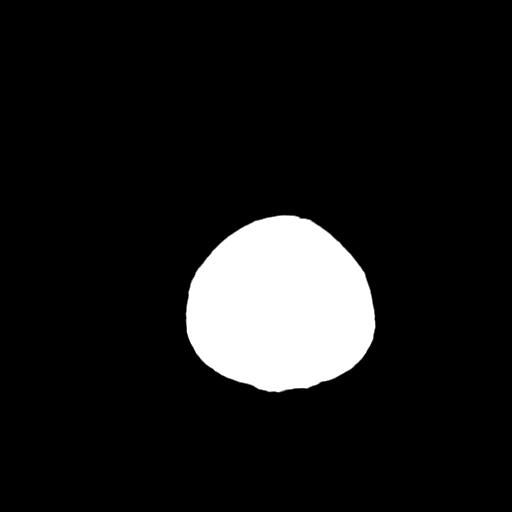
[im 10/10  brain]
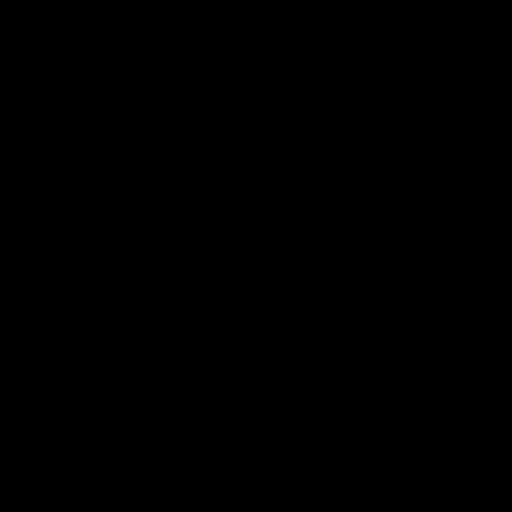

[Series 12: head bone · axial · 0.46mm/px · z∈[-188,-58]mm · 8 of 81 slices shown]
[im 8/81  bone]
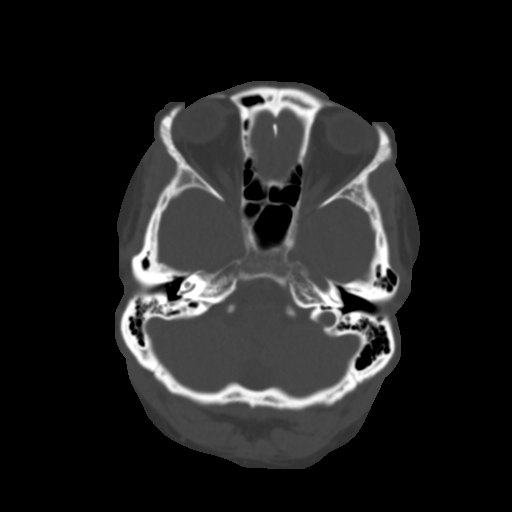
[im 16/81  bone]
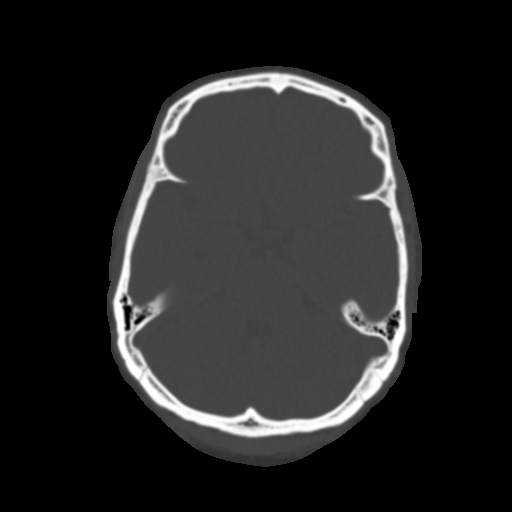
[im 27/81  bone]
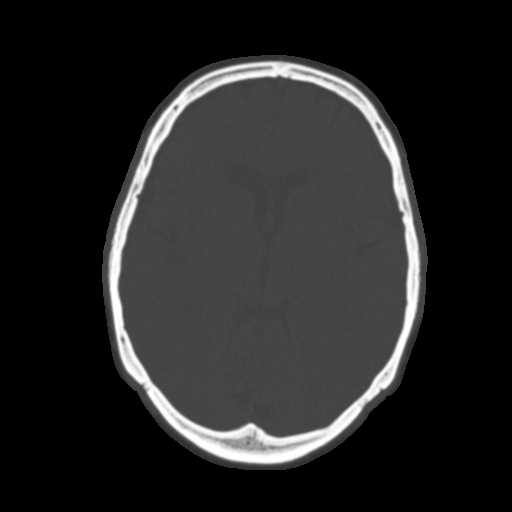
[im 35/81  bone]
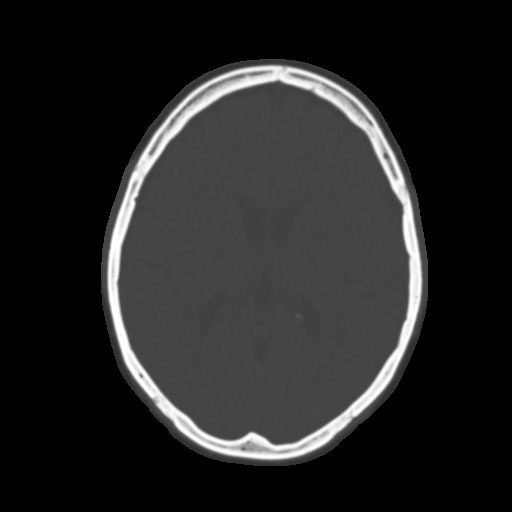
[im 46/81  bone]
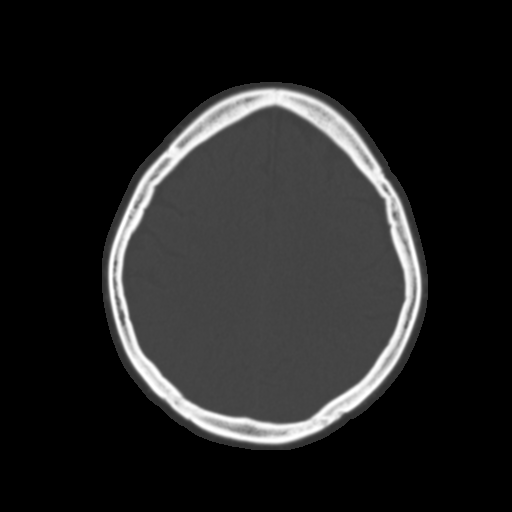
[im 54/81  bone]
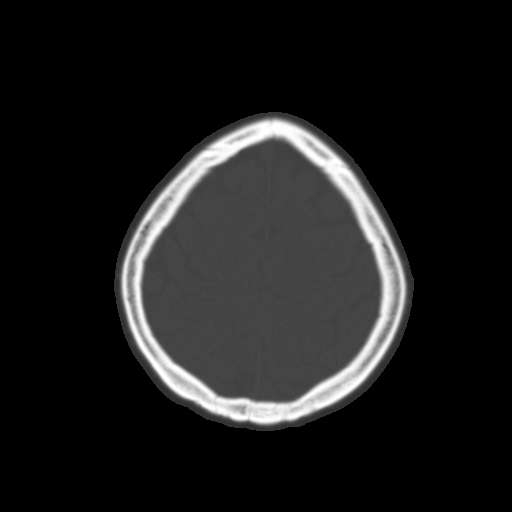
[im 65/81  bone]
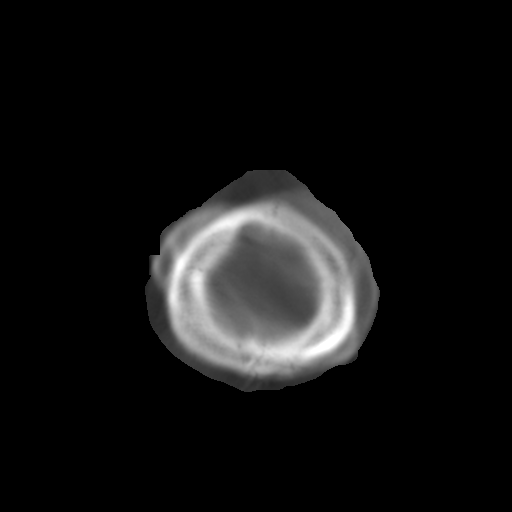
[im 73/81  bone]
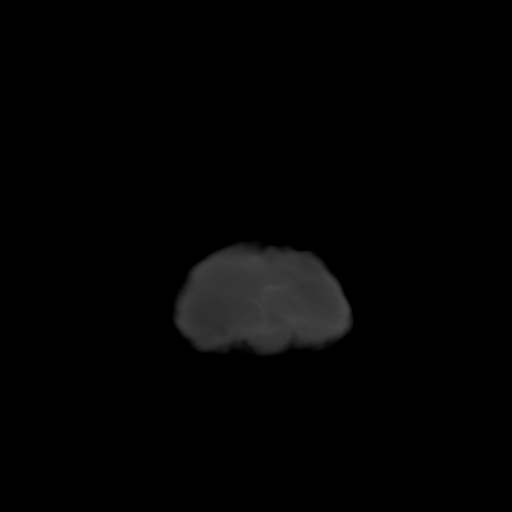

[16 of 47 positions shown; findings below may reference images not displayed]

FINDINGS: Brain: No evidence of acute vascular territory infarction,
hemorrhage, hydrocephalus, extra-axial collection or mass
lesion/mass effect.

Vascular: No hyperdense vessel identified.

Skull: No acute fracture.

Sinuses/Orbits: Visualized sinuses are clear. Unremarkable
visualized orbits.

Other: No mastoid effusions.
IMPRESSION: No evidence of acute intracranial abnormality.

## 2022-04-25 ENCOUNTER — Encounter (HOSPITAL_BASED_OUTPATIENT_CLINIC_OR_DEPARTMENT_OTHER): Payer: Self-pay | Admitting: Urology

## 2022-04-25 ENCOUNTER — Emergency Department (HOSPITAL_BASED_OUTPATIENT_CLINIC_OR_DEPARTMENT_OTHER)
Admission: EM | Admit: 2022-04-25 | Discharge: 2022-04-25 | Disposition: A | Discharge status: Home / Self Care | Payer: BC Managed Care – PPO | Attending: Emergency Medicine | Admitting: Emergency Medicine

## 2022-04-25 ENCOUNTER — Emergency Department (HOSPITAL_BASED_OUTPATIENT_CLINIC_OR_DEPARTMENT_OTHER): Payer: BC Managed Care – PPO

## 2022-04-25 ENCOUNTER — Other Ambulatory Visit: Payer: Self-pay

## 2022-04-25 DIAGNOSIS — R0789 Other chest pain: Secondary | ICD-10-CM | POA: Diagnosis present

## 2022-04-25 DIAGNOSIS — E1165 Type 2 diabetes mellitus with hyperglycemia: Secondary | ICD-10-CM | POA: Insufficient documentation

## 2022-04-25 DIAGNOSIS — F17200 Nicotine dependence, unspecified, uncomplicated: Secondary | ICD-10-CM | POA: Diagnosis not present

## 2022-04-25 DIAGNOSIS — I10 Essential (primary) hypertension: Secondary | ICD-10-CM | POA: Diagnosis not present

## 2022-04-25 LAB — BASIC METABOLIC PANEL
Anion gap: 9 (ref 5–15)
BUN: 11 mg/dL (ref 6–20)
CO2: 30 mmol/L (ref 22–32)
Calcium: 10.2 mg/dL (ref 8.9–10.3)
Chloride: 100 mmol/L (ref 98–111)
Creatinine, Ser: 0.87 mg/dL (ref 0.44–1.00)
GFR, Estimated: >60 mL/min (ref >60)
Glucose, Bld: 126 mg/dL — ABNORMAL HIGH (ref 70–99)
Potassium: 3.5 mmol/L (ref 3.5–5.1)
Sodium: 139 mmol/L (ref 135–145)

## 2022-04-25 LAB — CBC
HCT: 38 % (ref 36.0–46.0)
Hemoglobin: 12.3 g/dL (ref 12.0–15.0)
MCH: 26.3 pg (ref 26.0–34.0)
MCHC: 32.4 g/dL (ref 30.0–36.0)
MCV: 81.4 fL (ref 80.0–100.0)
Platelets: 304 10*3/uL (ref 150–400)
RBC: 4.67 MIL/uL (ref 3.87–5.11)
RDW: 14.8 % (ref 11.5–15.5)
WBC: 6.1 10*3/uL (ref 4.0–10.5)
nRBC: 0 % (ref 0.0–0.2)

## 2022-04-25 LAB — TROPONIN I (HIGH SENSITIVITY): Troponin I (High Sensitivity): 2 ng/L (ref <18)

## 2022-04-25 NOTE — Discharge Instructions (Signed)
Please use Tylenol or ibuprofen for pain.  You may use 600 mg ibuprofen every 6 hours or 1000 mg of Tylenol every 6 hours.  You may choose to alternate between the 2.  This would be most effective.  Not to exceed 4 g of Tylenol within 24 hours.  Not to exceed 3200 mg ibuprofen 24 hours.  

## 2022-04-25 NOTE — ED Notes (Signed)
OK for pt to have water per PA

## 2022-04-25 NOTE — ED Triage Notes (Signed)
Pt states chest pain that started last night  States it went away then came back States numbness and tingling in left arm and hand Also report left eye pain

## 2022-04-25 NOTE — ED Provider Notes (Signed)
[facility] Provider Note   CSN: [id number] Arrival date & time: 04/25/22  1615     History  Chief Complaint  Patient presents with   Chest Pain    Cassie Ward is a 51 y.o. female history significant for hypertension with no previous medical history significant for diabetes, hyperlipidemia, previous ACS, previous stroke, tobacco use who presents with concern for chest pain that she describes as intermittent squeezing, not associated with exertion since last night.  Patient reports that it was less severe last night, but she has had it more frequently today, with some associated left arm tingling, feeling of pressure behind the left eye, she reports that it is not significantly painful at this time, with occasional squeezes she rates 3-4 out of 10.  She denies any crushing sensation.  Patient reports that she does have a cardiologist already that she sees for high blood pressure, has not had previous stress test or echo.   Chest Pain      Home Medications Prior to Admission medications   Medication Sig Start Date End Date Taking? Authorizing Provider  ferrous sulfate 325 (65 FE) MG tablet Take 1 tablet (325 mg total) by mouth daily with breakfast. 09/26/20 11/07/20  Waymon Amato, MD  ibuprofen (ADVIL) 600 MG tablet Take 1 tablet (600 mg total) by mouth every 6 (six) hours. 09/25/20   Waymon Amato, MD  Multiple Vitamin (MULTIVITAMIN) capsule Take 1 capsule by mouth daily.    [provider]      Allergies    Lisinopril    Review of Systems   Review of Systems  Cardiovascular:  Positive for chest pain.  All other systems reviewed and are negative.   Physical Exam Updated Vital Signs BP (!) 140/90 (BP Location: Right Arm)   Pulse 74   Temp 97.7 F (36.5 C) (Oral)   Resp 16   Ht '5\' 4"'$  (1.626 m)   Wt 79.9 kg   LMP 07/18/2020   SpO2 100%   BMI 30.23 kg/m  Physical Exam Vitals and nursing note reviewed.  Constitutional:      General:  She is not in acute distress.    Appearance: Normal appearance.  HENT:     Head: Normocephalic and atraumatic.  Eyes:     General:        Right eye: No discharge.        Left eye: No discharge.  Cardiovascular:     Rate and Rhythm: Normal rate and regular rhythm.     Heart sounds: No murmur heard.    No friction rub. No gallop.  Pulmonary:     Effort: Pulmonary effort is normal.     Breath sounds: Normal breath sounds.  Abdominal:     General: Bowel sounds are normal.     Palpations: Abdomen is soft.  Skin:    General: Skin is warm and dry.     Capillary Refill: Capillary refill takes less than 2 seconds.  Neurological:     Mental Status: She is alert and oriented to person, place, and time.  Psychiatric:        Mood and Affect: Mood normal.        Behavior: Behavior normal.     ED Results / Procedures / Treatments   Labs (all labs ordered are listed, but only abnormal results are displayed) Labs Reviewed  BASIC METABOLIC PANEL - Abnormal; Notable for the following components:      Result Value   Glucose,  Bld 126 (*)    All other components within normal limits  CBC  PREGNANCY, URINE  TROPONIN I (HIGH SENSITIVITY)    EKG None  Radiology DG Chest 2 View  Result Date: 04/25/2022 CLINICAL DATA:  Chest pain EXAM: CHEST - 2 VIEW COMPARISON:  None Available. FINDINGS: The heart size and mediastinal contours are within normal limits. Both lungs are clear. The visualized skeletal structures are unremarkable. IMPRESSION: No acute abnormality of the lungs. Electronically Signed   By: Delanna Ahmadi M.D.   On: 04/25/2022 17:02    Procedures Procedures    Medications Ordered in ED Medications - No data to display  ED Course/ Medical Decision Making/ A&P                           Medical Decision Making Amount and/or Complexity of Data Reviewed Labs: ordered. Radiology: ordered.   This patient is a 51 y.o. female who presents to the ED for concern of chest pain,  this involves an extensive number of treatment options, and is a complaint that carries with it a high risk of complications and morbidity. The emergent differential diagnosis prior to evaluation includes, but is not limited to,  ACS, AAS, PE, Mallory-Weiss, Boerhaave's, Pneumonia, acute bronchitis, asthma or COPD exacerbation, anxiety, MSK pain or traumatic injury to the chest, acid reflux versus other .   This is not an exhaustive differential.   Past Medical History / Co-morbidities / Social History: Hypertension  Additional history: Chart reviewed. Pertinent results include: No previous echo or stress test on record  Physical Exam: Physical exam performed. The pertinent findings include: Patient in no acute distress, stable vital signs, mild hypertension with blood pressure 142/98, she has no tenderness palpation of the chest wall, no crepitus on chest wall  Lab Tests: I ordered, and personally interpreted labs.  The pertinent results include: BMP overall unremarkable, mild hyperglycemia nonfasting labs of 126, CBC unremarkable, troponin undetectable x 1 in context of chest pain ongoing for greater than 6 hours, no active chest pain at time of my evaluation   Imaging Studies: I ordered imaging studies including plain film chest x-ray. I independently visualized and interpreted imaging which showed no acute intrathoracic abnormality. I agree with the radiologist interpretation.   Cardiac Monitoring:  The patient was maintained on a cardiac monitor.  My attending physician Dr. Oswald Hillock viewed and interpreted the cardiac monitored which showed an underlying rhythm of: NSR. I agree with this interpretation.   Disposition: After consideration of the diagnostic results and the patients response to treatment, I feel that patient with heart score of 2-3 in context of nonworrisome story for ischemia, unremarkable workup, discussed differential including musculoskeletal pain, anxiety, acid reflux,  versus somatization of pain, encourage close cardiac follow-up for stress test, echo, but would not recommend further ED evaluation or admission at this time.   emergency department workup does not suggest an emergent condition requiring admission or immediate intervention beyond what has been performed at this time. The plan is: as above. The patient is safe for discharge and has been instructed to return immediately for worsening symptoms, change in symptoms or any other concerns.  I discussed this case with my attending physician Dr. Oswald Hillock who cosigned this note including patient's presenting symptoms, physical exam, and planned diagnostics and interventions. Attending physician stated agreement with plan or made changes to plan which were implemented.    Final Clinical Impression(s) / ED Diagnoses Final diagnoses:  Atypical chest pain    Rx / DC Orders ED Discharge Orders     None         Dorien Chihuahua 04/25/22 2112    Tretha Sciara, MD 04/25/22 [phone]

## 2023-01-11 ENCOUNTER — Other Ambulatory Visit (HOSPITAL_BASED_OUTPATIENT_CLINIC_OR_DEPARTMENT_OTHER): Payer: Self-pay

## 2023-01-11 ENCOUNTER — Emergency Department (HOSPITAL_BASED_OUTPATIENT_CLINIC_OR_DEPARTMENT_OTHER): Payer: No Typology Code available for payment source

## 2023-01-11 ENCOUNTER — Emergency Department (HOSPITAL_BASED_OUTPATIENT_CLINIC_OR_DEPARTMENT_OTHER)
Admission: EM | Admit: 2023-01-11 | Discharge: 2023-01-11 | Disposition: A | Discharge status: Home / Self Care | Payer: No Typology Code available for payment source | Attending: Emergency Medicine | Admitting: Emergency Medicine

## 2023-01-11 ENCOUNTER — Encounter (HOSPITAL_BASED_OUTPATIENT_CLINIC_OR_DEPARTMENT_OTHER): Payer: Self-pay | Admitting: Emergency Medicine

## 2023-01-11 ENCOUNTER — Other Ambulatory Visit: Payer: Self-pay

## 2023-01-11 DIAGNOSIS — M542 Cervicalgia: Secondary | ICD-10-CM | POA: Insufficient documentation

## 2023-01-11 DIAGNOSIS — R519 Headache, unspecified: Secondary | ICD-10-CM | POA: Diagnosis not present

## 2023-01-11 DIAGNOSIS — I10 Essential (primary) hypertension: Secondary | ICD-10-CM | POA: Diagnosis not present

## 2023-01-11 DIAGNOSIS — Z79899 Other long term (current) drug therapy: Secondary | ICD-10-CM | POA: Insufficient documentation

## 2023-01-11 DIAGNOSIS — Y92410 Unspecified street and highway as the place of occurrence of the external cause: Secondary | ICD-10-CM | POA: Diagnosis not present

## 2023-01-11 MED ORDER — ACETAMINOPHEN 500 MG PO TABS
1000.0000 mg | ORAL_TABLET | Freq: Once | ORAL | Status: AC
  Administered 2023-01-11: 1000 mg via ORAL
  Filled 2023-01-11: qty 2

## 2023-01-11 MED ORDER — AMLODIPINE BESYLATE 5 MG PO TABS
5.0000 mg | ORAL_TABLET | Freq: Every day | ORAL | 0 refills | Status: AC
  Filled 2023-01-11: qty 90, 90d supply, fill #0

## 2023-01-11 MED ORDER — CYCLOBENZAPRINE HCL 10 MG PO TABS
5.0000 mg | ORAL_TABLET | Freq: Every day | ORAL | 0 refills | Status: AC
  Filled 2023-01-11: qty 4, 8d supply, fill #0

## 2023-01-11 MED ORDER — IBUPROFEN 800 MG PO TABS
800.0000 mg | ORAL_TABLET | Freq: Once | ORAL | Status: AC
  Administered 2023-01-11: 800 mg via ORAL
  Filled 2023-01-11: qty 1

## 2023-01-11 MED ORDER — LOSARTAN POTASSIUM-HCTZ 100-25 MG PO TABS
1.0000 | ORAL_TABLET | Freq: Every day | ORAL | 0 refills | Status: AC
  Filled 2023-01-11: qty 90, 90d supply, fill #0
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
## 2023-01-19 ENCOUNTER — Emergency Department (HOSPITAL_BASED_OUTPATIENT_CLINIC_OR_DEPARTMENT_OTHER): Payer: Self-pay

## 2023-01-19 ENCOUNTER — Emergency Department (HOSPITAL_BASED_OUTPATIENT_CLINIC_OR_DEPARTMENT_OTHER)
Admission: EM | Admit: 2023-01-19 | Discharge: 2023-01-19 | Disposition: A | Discharge status: Home / Self Care | Payer: Self-pay | Attending: Emergency Medicine | Admitting: Emergency Medicine

## 2023-01-19 ENCOUNTER — Other Ambulatory Visit: Payer: Self-pay

## 2023-01-19 DIAGNOSIS — Z8616 Personal history of COVID-19: Secondary | ICD-10-CM | POA: Diagnosis not present

## 2023-01-19 DIAGNOSIS — Z79899 Other long term (current) drug therapy: Secondary | ICD-10-CM | POA: Diagnosis not present

## 2023-01-19 DIAGNOSIS — R202 Paresthesia of skin: Secondary | ICD-10-CM | POA: Insufficient documentation

## 2023-01-19 DIAGNOSIS — I10 Essential (primary) hypertension: Secondary | ICD-10-CM | POA: Insufficient documentation

## 2023-01-19 DIAGNOSIS — M545 Low back pain, unspecified: Secondary | ICD-10-CM | POA: Insufficient documentation

## 2023-01-19 MED ORDER — ACETAMINOPHEN 500 MG PO TABS
1000.0000 mg | ORAL_TABLET | Freq: Once | ORAL | Status: AC
  Administered 2023-01-19: 1000 mg via ORAL
  Filled 2023-01-19: qty 2

## 2023-01-19 MED ORDER — NAPROXEN 500 MG PO TABS: 500.0000 mg | ORAL_TABLET | Freq: Two times a day (BID) | ORAL | 0 refills | Status: AC

## 2023-01-19 NOTE — Discharge Instructions (Addendum)
Please take tylenol/ibuprofen/naproxen for pain. I recommend close follow-up with PCP for reevaluation.  Please do not hesitate to return to emergency department if worrisome signs symptoms we discussed become apparent.
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
## 2023-03-14 ENCOUNTER — Other Ambulatory Visit (HOSPITAL_COMMUNITY): Payer: Self-pay

## 2023-06-28 ENCOUNTER — Other Ambulatory Visit: Payer: Self-pay

## 2023-06-28 ENCOUNTER — Emergency Department (HOSPITAL_BASED_OUTPATIENT_CLINIC_OR_DEPARTMENT_OTHER)
Admission: EM | Admit: 2023-06-28 | Discharge: 2023-06-28 | Disposition: A | Discharge status: Home / Self Care | Payer: Self-pay | Attending: Emergency Medicine | Admitting: Emergency Medicine

## 2023-06-28 ENCOUNTER — Encounter (HOSPITAL_BASED_OUTPATIENT_CLINIC_OR_DEPARTMENT_OTHER): Payer: Self-pay | Admitting: Emergency Medicine

## 2023-06-28 ENCOUNTER — Emergency Department (HOSPITAL_BASED_OUTPATIENT_CLINIC_OR_DEPARTMENT_OTHER): Payer: Self-pay

## 2023-06-28 DIAGNOSIS — I10 Essential (primary) hypertension: Secondary | ICD-10-CM | POA: Insufficient documentation

## 2023-06-28 DIAGNOSIS — R519 Headache, unspecified: Secondary | ICD-10-CM | POA: Insufficient documentation

## 2023-06-28 DIAGNOSIS — Z79899 Other long term (current) drug therapy: Secondary | ICD-10-CM | POA: Insufficient documentation

## 2023-06-28 DIAGNOSIS — Z8616 Personal history of COVID-19: Secondary | ICD-10-CM | POA: Insufficient documentation

## 2023-06-28 LAB — RESP PANEL BY RT-PCR (RSV, FLU A&B, COVID)  RVPGX2
Influenza A by PCR: NEGATIVE
Influenza B by PCR: NEGATIVE
Resp Syncytial Virus by PCR: NEGATIVE
SARS Coronavirus 2 by RT PCR: NEGATIVE

## 2023-06-28 MED ORDER — KETOROLAC TROMETHAMINE 30 MG/ML IJ SOLN
30.0000 mg | Freq: Once | INTRAMUSCULAR | Status: AC
  Administered 2023-06-28: 30 mg via INTRAMUSCULAR
  Filled 2023-06-28: qty 1

## 2023-06-28 NOTE — ED Triage Notes (Signed)
 Elevated BP associated with intermittent "throbbing" headache and tingling in left arm onset ~ 1 hour pta. Reports BP at home was 150/120. Hx of HTN. On amlodipine and losartan - endorses compliance. Endorses increased salty snacks over the past few days. No current headache.

## 2023-06-28 NOTE — ED Provider Notes (Signed)
 [place] [facility] Provider Note   CSN: [id number] Arrival date & time: 06/28/23  0116     History  Chief Complaint  Patient presents with   Headache    Cassie Ward is a 53 y.o. female.  The history is provided by the patient.  Headache Location: left head. Radiates to:  Does not radiate Onset quality:  Gradual Timing:  Intermittent Progression:  Waxing and waning Chronicity: for a while. Context: not loud noise and not straining   Relieved by:  Nothing Worsened by:  Nothing Ineffective treatments:  None tried Associated symptoms: no fever, no numbness, no sore throat, no swollen glands, no syncope, no tingling, no URI, no visual change, no vomiting and no weakness   Risk factors: no anger   Patient with a h/o of headache and HTN presents with pain coming and going lasting seconds to minutes in the left head.  No weakness no numbness.     Past Medical History:  Diagnosis Date   Anemia    Concussion 2018   no residual    COVID 07/2017   fever body aches loss of taste and smell x 2 weeks all symptoms resolved except ooc body aches   Dysrhythmia    irregular heart beats on occ    Fibroids 2010   Headache    Migraine   Hypertension    Wears contact lenses      Home Medications Prior to Admission medications   Medication Sig Start Date End Date Taking? Authorizing Provider  amLODipine (NORVASC) 5 MG tablet Take 1 tablet (5 mg total) by mouth daily. 01/11/23 04/11/23  Arabella Merles, PA-C  ferrous sulfate 325 (65 FE) MG tablet Take 1 tablet (325 mg total) by mouth daily with breakfast. 09/26/20 11/07/20  Hoover Browns, MD  ibuprofen (ADVIL) 600 MG tablet Take 1 tablet (600 mg total) by mouth every 6 (six) hours. 09/25/20   Hoover Browns, MD  losartan-hydrochlorothiazide (HYZAAR) 100-25 MG tablet Take 1 tablet by mouth daily. 01/11/23 04/11/23  Arabella Merles, PA-C  Multiple Vitamin (MULTIVITAMIN) capsule Take 1 capsule by mouth daily.     [provider]  naproxen (NAPROSYN) 500 MG tablet Take 1 tablet (500 mg total) by mouth 2 (two) times daily. 01/19/23   Jeanelle Malling, PA      Allergies    Letrozole, Lisinopril, and Oxycodone-acetaminophen    Review of Systems   Review of Systems  Constitutional:  Negative for fever.  HENT:  Negative for sore throat.   Respiratory:  Negative for wheezing and stridor.   Cardiovascular:  Negative for syncope.  Gastrointestinal:  Negative for vomiting.  Neurological:  Positive for headaches. Negative for syncope, facial asymmetry, speech difficulty, weakness and numbness.  All other systems reviewed and are negative.   Physical Exam Updated Vital Signs BP (!) 146/102 (BP Location: Left Arm)   Pulse 70   Temp 98 F (36.7 C) (Oral)   Resp 17   Ht 5\' 4"  (1.626 m)   Wt 83.9 kg   LMP 07/18/2020   SpO2 96%   BMI 31.76 kg/m  Physical Exam Vitals and nursing note reviewed.  Constitutional:      General: She is not in acute distress.    Appearance: Normal appearance. She is well-developed.  HENT:     Head: Normocephalic and atraumatic.     Nose: Nose normal.     Mouth/Throat:     Mouth: Mucous membranes are moist.  Pharynx: Oropharynx is clear.  Eyes:     Extraocular Movements: Extraocular movements intact.     Pupils: Pupils are equal, round, and reactive to light.  Cardiovascular:     Rate and Rhythm: Normal rate and regular rhythm.     Pulses: Normal pulses.     Heart sounds: Normal heart sounds.  Pulmonary:     Effort: Pulmonary effort is normal. No respiratory distress.     Breath sounds: Normal breath sounds.  Abdominal:     General: Bowel sounds are normal. There is no distension.     Palpations: Abdomen is soft.     Tenderness: There is no abdominal tenderness. There is no guarding or rebound.  Musculoskeletal:        General: Normal range of motion.     Cervical back: Neck supple.  Skin:    General: Skin is dry.     Capillary Refill: Capillary refill  takes less than 2 seconds.     Findings: No erythema or rash.  Neurological:     General: No focal deficit present.     Mental Status: She is alert and oriented to person, place, and time.     Cranial Nerves: No cranial nerve deficit.     Deep Tendon Reflexes: Reflexes normal.  Psychiatric:        Mood and Affect: Mood normal.     ED Results / Procedures / Treatments   Labs (all labs ordered are listed, but only abnormal results are displayed) Labs Reviewed  RESP PANEL BY RT-PCR (RSV, FLU A&B, COVID)  [id number]    EKG None  Radiology CT Head Wo Contrast Result Date: 06/28/2023 CLINICAL DATA:  Hypertension, headache EXAM: CT HEAD WITHOUT CONTRAST TECHNIQUE: Contiguous axial images were obtained from the base of the skull through the vertex without intravenous contrast. RADIATION DOSE REDUCTION: This exam was performed according to the departmental dose-optimization program which includes automated exposure control, adjustment of the mA and/or kV according to patient size and/or use of iterative reconstruction technique. COMPARISON:  01/11/2023 FINDINGS: Brain: Normal anatomic configuration. No abnormal intra or extra-axial mass lesion or fluid collection. No abnormal mass effect or midline shift. No evidence of acute intracranial hemorrhage or infarct. Ventricular size is normal. Cerebellum unremarkable. Vascular: Unremarkable Skull: Intact Sinuses/Orbits: Paranasal sinuses are clear. Orbits are unremarkable. Other: Mastoid air cells and middle ear cavities are clear. IMPRESSION: 1. No acute intracranial abnormality. Electronically Signed   By: Helyn Numbers M.D.   On: 06/28/2023 02:31    Procedures Procedures    Medications Ordered in ED Medications  ketorolac (TORADOL) 30 MG/ML injection 30 mg (30 mg Intramuscular Given 06/28/23 0244)    ED Course/ Medical Decision Making/ A&P                                 Medical Decision Making Patient with left head pain coming and going    Amount and/or Complexity of Data Reviewed External Data Reviewed: notes.    Details: Previous notes reviewed  Labs: ordered.    Details: Negative covid and flu Radiology: ordered and independent interpretation performed.    Details: Normal head CT  Risk Prescription drug management. Risk Details: Patient is very well appearing.  Normal exam. Patient is reassured by normal head CT. Stable for discharge.       Final Clinical Impression(s) / ED Diagnoses Final diagnoses:  Nonintractable headache, unspecified chronicity pattern, unspecified headache type  I have reviewed the triage vital signs and the nursing notes. Pertinent labs & imaging results that were available during my care of the patient were reviewed by me and considered in my medical decision making (see chart for details). After history, exam, and medical workup I feel the patient has been appropriately medically screened and is safe for discharge home. Pertinent diagnoses were discussed with the patient. Patient was given return precautions.  Rx / DC Orders ED Discharge Orders     None         Kamyla Olejnik, MD 06/28/23 [phone]

## 2023-09-05 ENCOUNTER — Other Ambulatory Visit: Payer: Self-pay

## 2023-09-05 ENCOUNTER — Emergency Department (HOSPITAL_BASED_OUTPATIENT_CLINIC_OR_DEPARTMENT_OTHER)
Admission: EM | Admit: 2023-09-05 | Discharge: 2023-09-05 | Disposition: A | Discharge status: Home / Self Care | Payer: Self-pay | Attending: Emergency Medicine | Admitting: Emergency Medicine

## 2023-09-05 ENCOUNTER — Encounter (HOSPITAL_BASED_OUTPATIENT_CLINIC_OR_DEPARTMENT_OTHER): Payer: Self-pay | Admitting: Emergency Medicine

## 2023-09-05 DIAGNOSIS — F419 Anxiety disorder, unspecified: Secondary | ICD-10-CM | POA: Insufficient documentation

## 2023-09-05 NOTE — Discharge Instructions (Signed)
 Follow-up with ear, nose, and throat in the next week.  The contact information for [facility] has been provided in this discharge summary for you to call and make these arrangements.  Return to the ER if you develop severe sore throat, difficulty breathing, or for other new and concerning symptoms.
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
## 2023-09-05 NOTE — ED Triage Notes (Addendum)
 Patient presents feeling anxious after receiving MRI results done August 13, 2023. 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   Patient tearful in triage. States "I read online I need antibiotics but don't have a doctor right now".
